# Patient Record
Sex: Male | Born: 1980
Health system: Southern US, Community
[De-identification: ages and names within clinical notes are randomized; demographics above are authoritative.]

## PROBLEM LIST (undated history)

## (undated) DIAGNOSIS — E119 Type 2 diabetes mellitus without complications: Secondary | ICD-10-CM

## (undated) DIAGNOSIS — K219 Gastro-esophageal reflux disease without esophagitis: Secondary | ICD-10-CM

## (undated) DIAGNOSIS — I10 Essential (primary) hypertension: Secondary | ICD-10-CM

## (undated) HISTORY — DX: Type 2 diabetes mellitus without complications: E11.9

## (undated) HISTORY — PX: NO PAST SURGERIES: SHX2092

## (undated) HISTORY — DX: Gastro-esophageal reflux disease without esophagitis: K21.9

## (undated) HISTORY — DX: Essential (primary) hypertension: I10

---

## 2018-07-29 ENCOUNTER — Telehealth: Payer: Self-pay

## 2018-07-29 NOTE — Telephone Encounter (Signed)
Called patient to do their pre-visit COVID screening.  Call went to voicemail. Unable to do prescreening.  

## 2018-07-30 ENCOUNTER — Encounter: Payer: Self-pay | Admitting: Family Medicine

## 2018-07-30 ENCOUNTER — Telehealth: Payer: Self-pay | Admitting: Family Medicine

## 2018-07-30 ENCOUNTER — Other Ambulatory Visit: Payer: Self-pay

## 2018-07-30 ENCOUNTER — Ambulatory Visit (INDEPENDENT_AMBULATORY_CARE_PROVIDER_SITE_OTHER): Payer: 59 | Admitting: Family Medicine

## 2018-07-30 VITALS — BP 158/91 | HR 103 | Temp 97.2°F | Resp 17 | Ht 74.0 in | Wt 211.2 lb

## 2018-07-30 DIAGNOSIS — E782 Mixed hyperlipidemia: Secondary | ICD-10-CM

## 2018-07-30 DIAGNOSIS — E1165 Type 2 diabetes mellitus with hyperglycemia: Secondary | ICD-10-CM

## 2018-07-30 DIAGNOSIS — Z0000 Encounter for general adult medical examination without abnormal findings: Secondary | ICD-10-CM | POA: Diagnosis not present

## 2018-07-30 DIAGNOSIS — Z130 Encounter for screening for diseases of the blood and blood-forming organs and certain disorders involving the immune mechanism: Secondary | ICD-10-CM | POA: Diagnosis not present

## 2018-07-30 DIAGNOSIS — Z23 Encounter for immunization: Secondary | ICD-10-CM | POA: Diagnosis not present

## 2018-07-30 DIAGNOSIS — R030 Elevated blood-pressure reading, without diagnosis of hypertension: Secondary | ICD-10-CM | POA: Diagnosis not present

## 2018-07-30 DIAGNOSIS — R9431 Abnormal electrocardiogram [ECG] [EKG]: Secondary | ICD-10-CM | POA: Diagnosis not present

## 2018-07-30 DIAGNOSIS — Z1389 Encounter for screening for other disorder: Secondary | ICD-10-CM

## 2018-07-30 DIAGNOSIS — Z13220 Encounter for screening for lipoid disorders: Secondary | ICD-10-CM | POA: Diagnosis not present

## 2018-07-30 DIAGNOSIS — K760 Fatty (change of) liver, not elsewhere classified: Secondary | ICD-10-CM

## 2018-07-30 DIAGNOSIS — Z131 Encounter for screening for diabetes mellitus: Secondary | ICD-10-CM | POA: Diagnosis not present

## 2018-07-30 DIAGNOSIS — Z789 Other specified health status: Secondary | ICD-10-CM

## 2018-07-30 DIAGNOSIS — R748 Abnormal levels of other serum enzymes: Secondary | ICD-10-CM

## 2018-07-30 DIAGNOSIS — K429 Umbilical hernia without obstruction or gangrene: Secondary | ICD-10-CM

## 2018-07-30 DIAGNOSIS — Z8249 Family history of ischemic heart disease and other diseases of the circulatory system: Secondary | ICD-10-CM | POA: Diagnosis not present

## 2018-07-30 DIAGNOSIS — Z1329 Encounter for screening for other suspected endocrine disorder: Secondary | ICD-10-CM | POA: Diagnosis not present

## 2018-07-30 DIAGNOSIS — F1090 Alcohol use, unspecified, uncomplicated: Secondary | ICD-10-CM

## 2018-07-30 LAB — POCT URINALYSIS DIP (CLINITEK)
Glucose, UA: 500 mg/dL — AB
Leukocytes, UA: NEGATIVE
Nitrite, UA: NEGATIVE
POC PROTEIN,UA: 100 — AB
Spec Grav, UA: 1.025 (ref 1.010–1.025)
Urobilinogen, UA: 0.2 E.U./dL
pH, UA: 5.5 (ref 5.0–8.0)

## 2018-07-30 NOTE — Progress Notes (Signed)
Patient ID: Austin Little, male    DOB: 12-13-1980, 38 y.o.   MRN: [id number]  PCP: Bing NeighborsHarris, Joselinne Lawal S, FNP  Chief Complaint  Patient presents with  . Establish Care  . Annual Exam    Subjective:  HPI Austin Little is a 38 y.o. male, light smoker presents for complete physical exam.  Medical problems include: GERD, which is controlled with omeprazole.  Health Promotion: Routine physical activity: none Current BMI: Body mass index is 27.12 kg/m.  Dietary habits:eats twice per day. Trying to decrease fast foods. Not many vegetables. Eat fruits, although not daily. Very minimal intake of vegetables. Other habits include:  Alcohol beverages which consists of mostly beers on average 6-8 per day. Liquor occasionally, although not daily.  Health Screening Current/Overdue:   Immunizations: TDAP Last Dental Exam: routine dental  Last Eye Exam: no problems    Current home medications include: Prior to Admission medications   Medication Sig Start Date End Date Taking? Authorizing Provider  omeprazole (PRILOSEC OTC) 20 MG tablet Take 20 mg by mouth daily.   Yes [provider]    Family History  Problem Relation Age of Onset  . Hypertension Father   . Heart attack Father 3250's with stent placed  . Diabetes Paternal Grandmother   . Diabetes Paternal Grandmother   . Hypertension Paternal Grandfather <50  . Heart attack Paternal Grandfather <50    No Known Allergies  Social History   Socioeconomic History  . Marital status: Married    Spouse name: Not on file  . Number of children: Not on file  . Years of education: Not on file  . Highest education level: Not on file  Occupational History  . Not on file  Social Needs  . Financial resource strain: Not on file  . Food insecurity    Worry: Not on file    Inability: Not on file  . Transportation needs    Medical: Not on file    Non-medical: Not on file  Tobacco Use  . Smoking status: Light Tobacco Smoker     Types: Cigars  . Smokeless tobacco: Never Used  Substance and Sexual Activity  . Alcohol use: Yes  . Drug use: Never  . Sexual activity: Not on file  Lifestyle  . Physical activity    Days per week: Not on file    Minutes per session: Not on file  . Stress: Not on file  Relationships  . Social Musicianconnections    Talks on phone: Not on file    Gets together: Not on file    Attends religious service: Not on file    Active member of club or organization: Not on file    Attends meetings of clubs or organizations: Not on file    Relationship status: Not on file  . Intimate partner violence    Fear of current or ex partner: Not on file    Emotionally abused: Not on file    Physically abused: Not on file    Forced sexual activity: Not on file  Other Topics Concern  . Not on file  Social History Narrative  . Not on file    Review of Systems Pertinent negatives listed in HPI Past Medical, Surgical Family and Social History reviewed and updated.  Objective:   Today's Vitals   07/30/18 0905  BP: (!) 158/91  Pulse: (!) 103  Resp: 17  Temp: (!) 97.2 F (36.2 C)  TempSrc: Temporal  SpO2: 95%  Weight:  211 lb 3.2 oz (95.8 kg)  Height: 6\' 2"  (1.88 m)    Wt Readings from Last 3 Encounters:  07/30/18 211 lb 3.2 oz (95.8 kg)    Physical Exam Constitutional: Patient appears well-developed and well-nourished. No distress. HENT: Normocephalic, atraumatic, External right and left ear normal. Oropharynx is clear and moist.  Eyes: Conjunctivae and EOM are normal. PERRLA, no scleral icterus. Neck: Normal ROM. Neck supple. No JVD. No tracheal deviation. No thyromegaly. CVS: RRR, S1/S2 +, no murmurs, no gallops, no carotid bruit.  Pulmonary: Effort and breath sounds normal, no stridor, rhonchi, wheezes, rales.  Abdominal: Soft. BS +, no distension, tenderness, rebound or guarding.  Musculoskeletal: Normal range of motion. No edema and no tenderness.  Neuro: Alert. Normal reflexes,  muscle tone coordination. No cranial nerve deficit. Skin: Skin is warm and dry. No rash noted. Not diaphoretic. No erythema. No pallor. Psychiatric: Normal mood and affect. Behavior, judgment, thought content normal.    Assessment & Plan:  1. Annual physical exam Age-specific anticipatory guidance provided   2. Screening for diabetes mellitus - Comprehensive metabolic panel - Hemoglobin A1c  3. Screening for thyroid disorder - Thyroid Panel With TSH  4. Screening, lipid - Lipid panel  5. Screening for deficiency anemia - CBC with Differential  6. Screening for blood or protein in urine - POCT URINALYSIS DIP (CLINITEK)  7. Family history of heart disease - EKG 12-Lead, non-specific T, with NSR. Asymptomatic.  -Continue monitoring for factors that will further increase risk for a cardiovascular event.  8. Elevated BP without diagnosis of hypertension Repeat pressure remains elevated. Patient will check BP at home and return in 4 weeks for BP Check. If elevated, we discussed anti-hypertensive therapy is warranted.   9. Umbilical hernia without obstruction and without gangrene -CT of abdomen pending. -Will refer to General Surgery once CT completed.  Return for follow-up in 4 weeks for blood pressure check.  6 months surveillance of blood pressure with provider.  Molli Barrows, FNP Primary Care at [facility] [address] [place] [phone]: [phone]

## 2018-07-30 NOTE — Telephone Encounter (Signed)
Please schedule patient for CT of abdomen. Orders in system. He prefers a Saturday or late evening appointment as he works in [place]

## 2018-07-30 NOTE — Patient Instructions (Addendum)
Thank you for choosing Primary Care at [facility] to be your medical home!    Austin Little was seen by Molli Barrows, FNP today.   Austin Little's primary care provider is No primary care provider on file..   For the best care possible, you should try to see Molli Barrows, FNP-C whenever you come to the clinic.   We look forward to seeing you again soon!  If you have any questions about your visit today, please call us at [phone] or feel free to reach your primary care provider via [place].    Preventive Care 39-12 Years Old, Male Preventive care refers to lifestyle choices and visits with your health care provider that can promote health and wellness. This includes:  A yearly physical exam. This is also called an annual well check.  Regular dental and eye exams.  Immunizations.  Screening for certain conditions.  Healthy lifestyle choices, such as eating a healthy diet, getting regular exercise, not using drugs or products that contain nicotine and tobacco, and limiting alcohol use. What can I expect for my preventive care visit? Physical exam Your health care provider will check:  Height and weight. These may be used to calculate body mass index (BMI), which is a measurement that tells if you are at a healthy weight.  Heart rate and blood pressure.  Your skin for abnormal spots. Counseling Your health care provider may ask you questions about:  Alcohol, tobacco, and drug use.  Emotional well-being.  Home and relationship well-being.  Sexual activity.  Eating habits.  Work and work Statistician. What immunizations do I need?  Influenza (flu) vaccine  This is recommended every year. Tetanus, diphtheria, and pertussis (Tdap) vaccine  You may need a Td booster every 10 years. Varicella (chickenpox) vaccine  You may need this vaccine if you have not already been vaccinated. Human papillomavirus (HPV) vaccine  If recommended by your health care  provider, you may need three doses over 6 months. Measles, mumps, and rubella (MMR) vaccine  You may need at least one dose of MMR. You may also need a second dose. Meningococcal conjugate (MenACWY) vaccine  One dose is recommended if you are 39-79 years old and a Market researcher living in a residence hall, or if you have one of several medical conditions. You may also need additional booster doses. Pneumococcal conjugate (PCV13) vaccine  You may need this if you have certain conditions and were not previously vaccinated. Pneumococcal polysaccharide (PPSV23) vaccine  You may need one or two doses if you smoke cigarettes or if you have certain conditions. Hepatitis A vaccine  You may need this if you have certain conditions or if you travel or work in places where you may be exposed to hepatitis A. Hepatitis B vaccine  You may need this if you have certain conditions or if you travel or work in places where you may be exposed to hepatitis B. Haemophilus influenzae type b (Hib) vaccine  You may need this if you have certain risk factors. You may receive vaccines as individual doses or as more than one vaccine together in one shot (combination vaccines). Talk with your health care provider about the risks and benefits of combination vaccines. What tests do I need? Blood tests  Lipid and cholesterol levels. These may be checked every 5 years starting at age 39.  Hepatitis C test.  Hepatitis B test. Screening   Diabetes screening. This is done by checking your blood sugar (glucose) after  you have not eaten for a while (fasting).  Sexually transmitted disease (STD) testing. Talk with your health care provider about your test results, treatment options, and if necessary, the need for more tests. Follow these instructions at home: Eating and drinking   Eat a diet that includes fresh fruits and vegetables, whole grains, lean protein, and low-fat dairy products.  Take  vitamin and mineral supplements as recommended by your health care provider.  Do not drink alcohol if your health care provider tells you not to drink.  If you drink alcohol: ? Limit how much you have to 0-2 drinks a day. ? Be aware of how much alcohol is in your drink. In the [place] one drink equals one 12 oz bottle of beer (355 mL), one 5 oz glass of wine (148 mL), or one 1 oz glass of hard liquor (44 mL). Lifestyle  Take daily care of your teeth and gums.  Stay active. Exercise for at least 30 minutes on 5 or more days each week.  Do not use any products that contain nicotine or tobacco, such as cigarettes, e-cigarettes, and chewing tobacco. If you need help quitting, ask your health care provider.  If you are sexually active, practice safe sex. Use a condom or other form of protection to prevent STIs (sexually transmitted infections). What's next?  Go to your health care provider once a year for a well check visit.  Ask your health care provider how often you should have your eyes and teeth checked.  Stay up to date on all vaccines. This information is not intended to replace advice given to you by your health care provider. Make sure you discuss any questions you have with your health care provider. Document Released: 02/28/2001 Document Revised: 12/27/2017 Document Reviewed: 12/27/2017 Elsevier Patient Education  2020 [place].  Heart Disease Prevention Heart disease is the leading cause of death in the world. Coronary artery disease is the most common cause of heart disease. This condition results when cholesterol and other substances (plaque) build up inside the walls of the blood vessels that supply your heart muscle (arteries). This buildup in arteries is called atherosclerosis. You can take actions to lower your risk of heart disease. How can heart disease affect me? Heart disease can cause many unpleasant symptoms and complications, such as:  Chest pain  (angina).  Reduced or blocked blood flow to your heart. This can cause: ? Irregular heartbeats (arrhythmias). ? Heart attack. ? Heart failure. What can increase my risk? The following factors may make you more likely to develop this condition:  High blood pressure (hypertension).  High cholesterol.  Smoking.  A diet high in saturated fats or trans fats.  Lack of physical activity.  Obesity.  Drinking too much alcohol.  Diabetes.  Having a family history of heart disease. What actions can I take to prevent heart disease? Nutrition   Eat a heart-healthy eating plan as told by your health care provider. Examples include the DASH (Dietary Approaches to Stop Hypertension) eating plan or the Mediterranean diet.  Generally, it is recommended that you: ? Eat less salt (sodium). Ask your health care provider how much sodium is safe for you. Most people should have less than 2,300 mg each day. ? Limit unhealthy fats, such as saturated and trans fats, in your diet. You can do this by eating low-fat dairy products, eating less red meat, and avoiding processed foods. ? Eat healthy fats (omega-3 fatty acids). These are found in fish, such as  mackerel or salmon. ? Eat more fruits and vegetables. You should try to fill one-half of your plate with fruits and vegetables at each meal. ? Eat more whole grains. ? Avoid foods and drinks that have added sugars. Lifestyle   Get regular exercise. This is one of the most important things you can do for your health. Generally, it is recommended that you: ? Exercise for at least 30 minutes on most days of the week (150 minutes each week). The exercise should increase your heart rate and make you sweat (aerobic exercise). ? Add strength exercises on at least 2 days each week.  Do not use any products that contain nicotine or tobacco, such as cigarettes and e-cigarettes. These can damage your heart and blood vessels. If you need help quitting, ask  your health care provider. Alcohol use  Do not drink alcohol if: ? Your health care provider tells you not to drink. ? You are pregnant, may be pregnant, or are planning to become pregnant.  If you drink alcohol, limit how much you have: ? 0-1 drink a day for women. ? 0-2 drinks a day for men.  Be aware of how much alcohol is in your drink. In the [place]., one drink equals one typical bottle of beer (12 oz), one-half glass of wine (5 oz), or one shot of hard liquor (1 oz). Medicines  Take over-the-counter and prescription medicines only as told by your health care provider.  Ask your health care provider whether you should take an aspirin every day. Taking aspirin may help reduce your risk of heart disease and stroke.  Depending on your risk factors, your health care provider may prescribe medicines to lower your risk of heart disease or to control related conditions. You may take medicine to: ? Lower cholesterol. ? Control blood pressure. ? Control diabetes. General information  Keep your blood pressure under control, as recommended by your health care provider. For most healthy people, the upper number of your blood pressure (systolic) should be no higher than 120, and the lower number (diastolic) no higher than 80. Treatment may be needed if your blood pressure is higher than 130/80.  Have your blood pressure checked at least every two years. Your health care provider may check your blood pressure more often if you have high blood pressure.  After age 50, have your cholesterol checked every 4-6 years. If you have risk factors for heart disease, you may need to have it checked more frequently. Treatment may be needed if your cholesterol is high.  Have your body mass index (BMI) checked every year. Your health care provider can calculate your BMI from your height and weight.  Work with your health care provider to lose weight, if needed, or to maintain a healthy weight. Where to find  more information:  Centers for Disease Control and Prevention: TextNotebook.fi  American Heart Association: www.heart.org ? Take a free online heart disease risk quiz to better understand your personal risk factors. Summary  Heart disease is the leading cause of death in the world.  Heart disease can cause chest pain, abnormal heart rhythms, heart attack, and heart failure.  High blood pressure, high cholesterol, and smoking are the main risk factors for heart disease, although other factors also contribute.  You can take actions to lower your chances of developing heart disease. Work with your health care provider to reduce your risk by following a heart-healthy diet, being physically active, and controlling your weight, blood pressure, and cholesterol level.  This information is not intended to replace advice given to you by your health care provider. Make sure you discuss any questions you have with your health care provider. Document Released: 08/17/2003 Document Revised: 01/17/2017 Document Reviewed: 01/17/2017 Elsevier Patient Education  2020 [employer].

## 2018-07-31 LAB — CBC WITH DIFFERENTIAL/PLATELET
Basophils Absolute: 0 10*3/uL (ref 0.0–0.2)
Basos: 1 %
EOS (ABSOLUTE): 0.1 10*3/uL (ref 0.0–0.4)
Eos: 1 %
Hematocrit: 48.5 % (ref 37.5–51.0)
Hemoglobin: 16.8 g/dL (ref 13.0–17.7)
Immature Grans (Abs): 0 10*3/uL (ref 0.0–0.1)
Immature Granulocytes: 0 %
Lymphocytes Absolute: 1.4 10*3/uL (ref 0.7–3.1)
Lymphs: 26 %
MCH: 32.2 pg (ref 26.6–33.0)
MCHC: 34.6 g/dL (ref 31.5–35.7)
MCV: 93 fL (ref 79–97)
Monocytes Absolute: 0.5 10*3/uL (ref 0.1–0.9)
Monocytes: 10 %
Neutrophils Absolute: 3.3 10*3/uL (ref 1.4–7.0)
Neutrophils: 62 %
Platelets: 297 10*3/uL (ref 150–450)
RBC: 5.22 x10E6/uL (ref 4.14–5.80)
RDW: 12.4 % (ref 11.6–15.4)
WBC: 5.3 10*3/uL (ref 3.4–10.8)

## 2018-07-31 LAB — LIPID PANEL
Chol/HDL Ratio: 8.8 ratio — ABNORMAL HIGH (ref 0.0–5.0)
Cholesterol, Total: 327 mg/dL — ABNORMAL HIGH (ref 100–199)
HDL: 37 mg/dL — ABNORMAL LOW (ref >39)
Triglycerides: 555 mg/dL (ref 0–149)

## 2018-07-31 LAB — THYROID PANEL WITH TSH
Free Thyroxine Index: 2.2 (ref 1.2–4.9)
T3 Uptake Ratio: 27 % (ref 24–39)
T4, Total: 8 ug/dL (ref 4.5–12.0)
TSH: 3.33 u[IU]/mL (ref 0.450–4.500)

## 2018-07-31 LAB — COMPREHENSIVE METABOLIC PANEL
ALT: 170 IU/L — ABNORMAL HIGH (ref 0–44)
AST: 129 IU/L — ABNORMAL HIGH (ref 0–40)
Albumin/Globulin Ratio: 2.2 (ref 1.2–2.2)
Albumin: 5.2 g/dL — ABNORMAL HIGH (ref 4.0–5.0)
Alkaline Phosphatase: 166 IU/L — ABNORMAL HIGH (ref 39–117)
BUN/Creatinine Ratio: 10 (ref 9–20)
BUN: 8 mg/dL (ref 6–20)
Bilirubin Total: 0.7 mg/dL (ref 0.0–1.2)
CO2: 23 mmol/L (ref 20–29)
Calcium: 9.7 mg/dL (ref 8.7–10.2)
Chloride: 99 mmol/L (ref 96–106)
Creatinine, Ser: 0.8 mg/dL (ref 0.76–1.27)
GFR calc Af Amer: 131 mL/min/{1.73_m2} (ref >59)
GFR calc non Af Amer: 113 mL/min/{1.73_m2} (ref >59)
Globulin, Total: 2.4 g/dL (ref 1.5–4.5)
Glucose: 312 mg/dL — ABNORMAL HIGH (ref 65–99)
Potassium: 4.2 mmol/L (ref 3.5–5.2)
Sodium: 139 mmol/L (ref 134–144)
Total Protein: 7.6 g/dL (ref 6.0–8.5)

## 2018-07-31 LAB — HEMOGLOBIN A1C
Est. average glucose Bld gHb Est-mCnc: 269 mg/dL
Hgb A1c MFr Bld: 11 % — ABNORMAL HIGH (ref 4.8–5.6)

## 2018-07-31 NOTE — Telephone Encounter (Addendum)
Will call [phone] to see if CPT 502 715 [id number] requires prior authorization.  Since ordering provider & imaging facility are both in network no PA is required.

## 2018-07-31 NOTE — Telephone Encounter (Signed)
CT scheduled for 08/02/2018 @ 3:45 pm. NPO at 12 pm. Patient will need to pick up contrast at [facility] today or tomorrow. Will drink first bottle of contrast at 2 pm & 2nd bottle at 3 pm.

## 2018-07-31 NOTE — Telephone Encounter (Signed)
Patient aware of appointment. States he'll have wife pick up contrast. Made a phone visit for 08/01/18 at 9:30 AM.

## 2018-08-01 ENCOUNTER — Encounter: Payer: Self-pay | Admitting: Family Medicine

## 2018-08-01 ENCOUNTER — Other Ambulatory Visit: Payer: Self-pay

## 2018-08-01 ENCOUNTER — Ambulatory Visit: Payer: 59 | Admitting: Family Medicine

## 2018-08-01 ENCOUNTER — Ambulatory Visit (INDEPENDENT_AMBULATORY_CARE_PROVIDER_SITE_OTHER): Payer: 59 | Admitting: Family Medicine

## 2018-08-01 DIAGNOSIS — E782 Mixed hyperlipidemia: Secondary | ICD-10-CM | POA: Diagnosis not present

## 2018-08-01 DIAGNOSIS — E1165 Type 2 diabetes mellitus with hyperglycemia: Secondary | ICD-10-CM | POA: Insufficient documentation

## 2018-08-01 DIAGNOSIS — R030 Elevated blood-pressure reading, without diagnosis of hypertension: Secondary | ICD-10-CM | POA: Diagnosis not present

## 2018-08-01 MED ORDER — FENOFIBRATE 145 MG PO TABS: 145.0000 mg | ORAL_TABLET | Freq: Every day | ORAL | 1 refills | Status: DC

## 2018-08-01 MED ORDER — METFORMIN HCL 1000 MG PO TABS: 1000.0000 mg | ORAL_TABLET | Freq: Two times a day (BID) | ORAL | 3 refills | Status: DC

## 2018-08-01 MED ORDER — BLOOD GLUCOSE MONITOR KIT: PACK | 0 refills | Status: DC

## 2018-08-01 MED ORDER — LOSARTAN POTASSIUM 25 MG PO TABS: 25.0000 mg | ORAL_TABLET | Freq: Every day | ORAL | 4 refills | Status: DC

## 2018-08-01 MED ORDER — SITAGLIPTIN PHOSPHATE 100 MG PO TABS: 100.0000 mg | ORAL_TABLET | Freq: Every day | ORAL | 1 refills | Status: DC

## 2018-08-01 MED FILL — metFORMIN HCL 1000 MG TABS: 1000 | 90 days supply | Qty: 180 | Fill #0

## 2018-08-01 MED FILL — FREESTYLE LITE METER: 20 days supply | Qty: 1 | Fill #0

## 2018-08-01 MED FILL — FREESTYLE LANCETS: 50 days supply | Qty: 200 | Fill #0

## 2018-08-01 MED FILL — FREESTYLE LITE TEST STRIP: 50 days supply | Qty: 200 | Fill #0

## 2018-08-01 MED FILL — LOSARTAN POTASSIUM 25 MG TA: 25 | 30 days supply | Qty: 30 | Fill #0

## 2018-08-01 MED FILL — FENOFIBRATE 145 MG TABS: 145 | 90 days supply | Qty: 90 | Fill #0

## 2018-08-01 MED FILL — JANUVIA 100 MG TABLET: 100 | 30 days supply | Qty: 30 | Fill #0

## 2018-08-01 NOTE — Progress Notes (Deleted)
Worked up patient for their telephone visit with provider Molli Barrows, FNP-C. Verified date of birth. Patient here to discuss recent lab results. KWalker, CMA.

## 2018-08-01 NOTE — Progress Notes (Signed)
Virtual Visit via Telephone Note  I connected with [name] on 08/01/18 at 10:50 AM EDT by telephone and verified that I am speaking with the correct person using two identifiers.  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 He is conducting today's encounter with use of his personal phone. His wife Mardene Celeste is also on another line which is conferenced in during today's encounter.  Provider: Located at primary care office    I discussed the limitations, risks, security and privacy concerns of performing an evaluation and management service by telephone and the availability of in person appointments. I also discussed with the patient that there may be a patient responsible charge related to this service. The patient expressed understanding and agreed to proceed.   History of Present Illness: Austin Little was seen in office two days ago for a routine physical exam.  He is present during today's telemedicine encounter to discuss his most recent lab results.  Patient's Results number significant for very poorly controlled type 2 diabetes which is a brand-new diagnosis for patient.  He denies any previous knowledge of having diabetes and denies any symptoms of hyperglycemia.  His A1c 2 days ago was 11.  Of significance his cholesterol was also grossly abnormal.  His triglycerides were 555 and total cholesterol 327.  Due to triglycerides being so grossly elevated LDLs were unable to be calculated.  During Mr. Reiland physical exam he did endorse a very heavy alcohol intake and sedentary lifestyle along with poor dietary choices.  Also of concern his liver enzymes were also grossly abnormal-AST 129 and ALT 170.  Patient is scheduled to have a CT of the abdomen and pelvis completed on 08/02/2018 for evaluation of umbilical hernia.  That study will also look at the liver however patient will need repeat liver enzymes at his follow-up appointment which is been scheduled for 4  weeks from now.  Assessment and Plan: 1. Type 2 Diabetes with hyperglycemia Start metformin 1000 mg twice daily and Januvia 100 mg once daily.  Educated extensively that if A1c does not significantly improve patient will likely warrant addition of long-acting insulin at his next follow-up appointment goal at follow-up appointment is for A1c to trend down to at least 10.5-10 or less.  Patient is encouraged to immediately begin routine physical activity and he must negative management to cut back substantially on his alcohol intake which is a huge contributor to poorly controlled diabetes. Starting on ARB for renal protection-patient did have a slightly elevated blood pressure reading during his initial visit.  Patient prescribed a glucometer to check blood sugar at least twice daily  2.  Mixed hypertriglyceridemia and hypercholesterolemia Start fenofibrate 145 mg once daily.  Will hold off on statin therapy given marked elevation of liver enzymes.  Encouraged dietary changes to include mostly fruits and vegetables.  He will need to discontinue eating any chips, sugary foods, fast food , and foods high in saturated fat at this will only worsen or his overall cholesterol  3.  Elevated liver enzymes Continue with CT of the abdomen pelvis this will allow views of the liver to rule out any underlying fatty liver disease.  If fatty liver disease is noted will refer to [facility] for further evaluation given patient's history of very heavy alcohol consumption.  patient and wife both verbalized understanding regarding labs and treatment plan as follows   Meds ordered this encounter  Medications  . sitaGLIPtin (JANUVIA) 100 MG tablet  Sig: Take 1 tablet (100 mg total) by mouth daily.    Dispense:  30 tablet    Refill:  1  . metFORMIN (GLUCOPHAGE) 1000 MG tablet    Sig: Take 1 tablet (1,000 mg total) by mouth 2 (two) times daily with a meal.    Dispense:  180 tablet    Refill:  3  . fenofibrate (TRICOR) 145  MG tablet    Sig: Take 1 tablet (145 mg total) by mouth daily.    Dispense:  90 tablet    Refill:  1  . DISCONTD: blood glucose meter kit and supplies KIT    Sig: Dispense based on patient and insurance preference. Use up to four times daily as directed. (ICD-10-E.11.89)    Dispense:  1 each    Refill:  0    Order Specific Question:   Number of strips    Answer:   200    Order Specific Question:   Number of lancets    Answer:   200  . losartan (COZAAR) 25 MG tablet    Sig: Take 1 tablet (25 mg total) by mouth daily.    Dispense:  30 tablet    Refill:  4  . blood glucose meter kit and supplies KIT    Sig: Dispense based on patient and insurance preference. Use up to four times daily as directed. (ICD-10-E.11.89)    Dispense:  1 each    Refill:  0    Order Specific Question:   Number of strips    Answer:   200    Order Specific Question:   Number of lancets    Answer:   200      Follow Up Instructions: Follow-up in 4 weeks with a provider for repeat liver enzymes, lipid panel, repeat A1C. Encouraged to bring meter to follow-up appointment.   I discussed the assessment and treatment plan with the patient. The patient was provided an opportunity to ask questions and all were answered. The patient agreed with the plan and demonstrated an understanding of the instructions.   The patient was advised to call back or seek an in-person evaluation if the symptoms worsen or if the condition fails to improve as anticipated.  I provided 20 minutes of non-face-to-face time during this encounter.   Molli Barrows, FNP-C

## 2018-08-02 ENCOUNTER — Ambulatory Visit (HOSPITAL_COMMUNITY): Payer: 59

## 2018-08-02 ENCOUNTER — Ambulatory Visit (HOSPITAL_COMMUNITY)
Admission: RE | Admit: 2018-08-02 | Discharge: 2018-08-02 | Disposition: A | Discharge status: Home / Self Care | Payer: 59 | Source: Ambulatory Visit | Attending: Family Medicine | Admitting: Family Medicine

## 2018-08-02 ENCOUNTER — Other Ambulatory Visit: Payer: 59

## 2018-08-02 ENCOUNTER — Other Ambulatory Visit: Payer: Self-pay

## 2018-08-02 DIAGNOSIS — K429 Umbilical hernia without obstruction or gangrene: Secondary | ICD-10-CM | POA: Diagnosis not present

## 2018-08-02 DIAGNOSIS — K760 Fatty (change of) liver, not elsewhere classified: Secondary | ICD-10-CM | POA: Diagnosis not present

## 2018-08-02 MED ORDER — IOHEXOL 300 MG/ML  SOLN
100.0000 mL | Freq: Once | INTRAMUSCULAR | Status: AC | PRN
  Administered 2018-08-02: 16:00:00 100 mL via INTRAVENOUS

## 2018-08-02 MED ORDER — SODIUM CHLORIDE (PF) 0.9 % IJ SOLN
INTRAMUSCULAR | Status: AC
  Filled 2018-08-02: qty 50

## 2018-08-05 ENCOUNTER — Telehealth: Payer: Self-pay | Admitting: Family Medicine

## 2018-08-05 NOTE — Telephone Encounter (Signed)
Austin Little, I apologize , please notify patient that I am also placing a referral to cardiology given his family history of  (first-degree relatives of male gender with severe heart disease I am referring him to cardiology for further evaluation given his  abnormally high cholesterol this places him at increased risk for cardiovascular event.  Given his work schedule and upcoming appointments he may have to push this appointment out which is fine.

## 2018-08-05 NOTE — Telephone Encounter (Signed)
Left voice mail to call back 

## 2018-08-05 NOTE — Telephone Encounter (Signed)
Patient verified date of birth. He is aware of findings on CT. CT confirmed umbilical hernia for which he has been referred to [facility] [place] [facility]. Patient aware that [facility] will contact him directly to schedule. Patient is aware that CT also revealed a fatty infiltrate located on his liver; related to heavy alcohol use. 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 [facility] will contact directly to schedule. Allow 2-4 weeks for their office to contact. Patient expressed understanding. Nat Christen, CMA

## 2018-08-05 NOTE — Addendum Note (Signed)
Addended by: Scot Jun on: 08/05/2018 11:16 AM   Modules accepted: Orders

## 2018-08-05 NOTE — Addendum Note (Signed)
Addended by: Scot Jun on: 08/05/2018 04:53 PM   Modules accepted: Orders

## 2018-08-05 NOTE — Telephone Encounter (Signed)
Patient's wife notified of CT results & recommendations. Expressed understanding. Is aware that they will be getting called from [facility] about appointments.

## 2018-08-05 NOTE — Telephone Encounter (Signed)
Contact patient to notify him as follows regarding his recent CT of the abdomen Patient CT of the abdomen did confirm a umbilical hernia for which she has been referred to [facility] surgery for evaluation of hernia.  Of note an incidental finding found on CT of the abdomen is that patient has a fatty infiltrate located on his liver which is what I suspected and is related to heavy alcohol use.  Given his significantly elevated liver enzymes along with this finding on CT of the abdomen I am referring him to [facility] liver care for further evaluation of alcohol induced fatty liver disease.  This referral is being placed more for proactive approach as to I am unable to determine by the studies have advanced or how severe the current damage is to his liver.  Given that he is very young vigilance is required to reduce the risk of complications associated with alcohol liver disease.  [facility] will contact patient directly to schedule an appointment please allow up to 2 to 4 weeks for their office to reach out to patient.

## 2018-08-06 NOTE — Telephone Encounter (Signed)
Patient's wife notified of referral.

## 2018-08-26 ENCOUNTER — Telehealth: Payer: Self-pay

## 2018-08-26 NOTE — Telephone Encounter (Signed)
Called patient to do their pre-visit COVID screening.  Have you been tested for COVID or are you currently waiting for COVID test results? no  Have you recently traveled internationally([place], [place], [place], [place], [place]) or within the [place] to a hotspot area([place], [place], [place], [place], [place])? no  Are you currently experiencing any of the following: fever, cough, SHOB, fatigue, body aches, loss of smell, rash, diarrhea, vomiting, severe headaches, weakness, sore throat? no  Have you been in contact with anyone who has recently travelled? no  Have you been in contact with anyone who is experiencing any of the above symptoms or been diagnosed with [place]  or works in or has recently visited a SNF? No  Asked patient to bring glucometer with him to appointment in the morning.

## 2018-08-27 ENCOUNTER — Ambulatory Visit (INDEPENDENT_AMBULATORY_CARE_PROVIDER_SITE_OTHER): Payer: 59 | Admitting: Internal Medicine

## 2018-08-27 ENCOUNTER — Encounter: Payer: Self-pay | Admitting: Internal Medicine

## 2018-08-27 ENCOUNTER — Other Ambulatory Visit: Payer: Self-pay

## 2018-08-27 VITALS — BP 127/81 | HR 87 | Temp 97.3°F | Resp 17 | Ht 74.0 in | Wt 213.6 lb

## 2018-08-27 DIAGNOSIS — F1010 Alcohol abuse, uncomplicated: Secondary | ICD-10-CM | POA: Diagnosis not present

## 2018-08-27 DIAGNOSIS — Z2821 Immunization not carried out because of patient refusal: Secondary | ICD-10-CM

## 2018-08-27 DIAGNOSIS — K700 Alcoholic fatty liver: Secondary | ICD-10-CM | POA: Diagnosis not present

## 2018-08-27 DIAGNOSIS — R945 Abnormal results of liver function studies: Secondary | ICD-10-CM

## 2018-08-27 DIAGNOSIS — K429 Umbilical hernia without obstruction or gangrene: Secondary | ICD-10-CM | POA: Diagnosis not present

## 2018-08-27 DIAGNOSIS — F17210 Nicotine dependence, cigarettes, uncomplicated: Secondary | ICD-10-CM | POA: Diagnosis not present

## 2018-08-27 DIAGNOSIS — I10 Essential (primary) hypertension: Secondary | ICD-10-CM | POA: Diagnosis not present

## 2018-08-27 DIAGNOSIS — R7989 Other specified abnormal findings of blood chemistry: Secondary | ICD-10-CM | POA: Insufficient documentation

## 2018-08-27 DIAGNOSIS — E782 Mixed hyperlipidemia: Secondary | ICD-10-CM

## 2018-08-27 DIAGNOSIS — E1142 Type 2 diabetes mellitus with diabetic polyneuropathy: Secondary | ICD-10-CM | POA: Diagnosis not present

## 2018-08-27 LAB — GLUCOSE, POCT (MANUAL RESULT ENTRY): POC Glucose: 130 mg/dl — AB (ref 70–99)

## 2018-08-27 NOTE — Progress Notes (Signed)
States that FSBS fasting have been in the 120s-130s

## 2018-08-27 NOTE — Progress Notes (Signed)
Patient ID: Austin Little, male    DOB: [birth year]  MRN: [id number]  CC: Diabetes and Hypertension   Subjective: Austin Little is a 38 y.o. male who presents for chronic ds management.  Patient last saw NP Molli Barrows about 1 month ago His concerns today include:  DM 2, mixed HL, ETOH use disorder, abn LFT, umbilical hernia, fatty liver, HTN, occasional cigar smoker  DIABETES TYPE 2 Last A1C:   Results for orders placed or performed in visit on 08/27/18  Glucose (CBG)  Result Value Ref Range   POC Glucose 130 (A) 70 - 99 mg/dl    Med Adherence:  '[x]'  Yes.  On Januvia and metformin    '[]'  No Medication side effects:  '[]'  Yes    '[x]'  No Home Monitoring?  '[x]'  Yes  At bedtime and sometimes before and after taking meds    Home glucose results range:  Few times BS over 200 but most part in 120.  Most recent A1c was 11. Diet Adherence:  Does not drink sodas, juices or sweet tea.  Not getting in much fruits or vegetables; was 6-8 12 oz beers a day now down to 2-4 beers/day.  He does not feel that he has a problem with alcohol.  Denied any issues like DWI or being dismissed from a job because of excessive EtOH use     Exercise: '[]'  Yes    '[x]'  No - no exercise outside of work.  He was going to the gym but stop due to umbilical hernia and has 1.5 hrs commute 1 way to and from work.   Hypoglycemic episodes?: '[]'  Yes    '[x]'  No Numbness of the feet? '[x]'  Yes  -constant tingling in feet for a little less than 1 yr Retinopathy hx? '[]'  Yes    '[]'  No Last eye exam:  No blurred vision. Plans to get eye exam done Comments:   HYPERTENSION Currently taking: see medication list Med Adherence: '[x]'  Yes    '[]'  No Medication side effects: '[]'  Yes    '[x]'  No Adherence with salt restriction: '[x]'  Yes    '[]'  No Home Monitoring?: '[x]'  Yes - wife checks it for him    Monitoring Frequency:  Every night Home BP results range:   Does not recall SOB? '[]'  Yes    '[x]'  No Chest Pain?: '[]'  Yes    '[x]'  No Leg swelling?: '[]'  Yes     '[x]'  No Headaches?: '[]'  Yes    '[x]'  No Dizziness? '[]'  Yes    '[x]'  No Comments:   Abn LFTs: Had recent CAT scan of the abdomen that revealed small umbilical hernia containing fat and fatty infiltration of the liver.  Patient noted to have elevation of AST and ALT and mild elevation in alk phos.  He denies any discomfort from the umbilical hernia  Hyperlipidemia: Found to have triglycerides greater than 500.  Patient was started on fenofibrate which he reports he has been taking consistently.  Patient Active Problem List   Diagnosis Date Noted  . Type 2 diabetes mellitus with hyperglycemia, without long-term current use of insulin ([place]) 08/01/2018     Current Outpatient Medications on File Prior to Visit  Medication Sig Dispense Refill  . fenofibrate (TRICOR) 145 MG tablet Take 1 tablet (145 mg total) by mouth daily. 90 tablet 1  . FREESTYLE LITE test strip     . Lancets (FREESTYLE) lancets     . losartan (COZAAR) 25 MG tablet Take 1 tablet (25 mg  total) by mouth daily. 30 tablet 4  . metFORMIN (GLUCOPHAGE) 1000 MG tablet Take 1 tablet (1,000 mg total) by mouth 2 (two) times daily with a meal. 180 tablet 3  . omeprazole (PRILOSEC OTC) 20 MG tablet Take 20 mg by mouth daily.    . sitaGLIPtin (JANUVIA) 100 MG tablet Take 1 tablet (100 mg total) by mouth daily. 30 tablet 1   No current facility-administered medications on file prior to visit.     No Known Allergies  Social History   Socioeconomic History  . Marital status: Married    Spouse name: Not on file  . Number of children: Not on file  . Years of education: Not on file  . Highest education level: Not on file  Occupational History  . Not on file  Social Needs  . Financial resource strain: Not on file  . Food insecurity    Worry: Not on file    Inability: Not on file  . Transportation needs    Medical: Not on file    Non-medical: Not on file  Tobacco Use  . Smoking status: Light Tobacco Smoker    Types: Cigars  .  Smokeless tobacco: Never Used  Substance and Sexual Activity  . Alcohol use: Yes  . Drug use: Never  . Sexual activity: Not on file  Lifestyle  . Physical activity    Days per week: Not on file    Minutes per session: Not on file  . Stress: Not on file  Relationships  . Social Herbalist on phone: Not on file    Gets together: Not on file    Attends religious service: Not on file    Active member of club or organization: Not on file    Attends meetings of clubs or organizations: Not on file    Relationship status: Not on file  . Intimate partner violence    Fear of current or ex partner: Not on file    Emotionally abused: Not on file    Physically abused: Not on file    Forced sexual activity: Not on file  Other Topics Concern  . Not on file  Social History Narrative  . Not on file    Family History  Problem Relation Age of Onset  . Hypertension Father   . Heart attack Father   . Diabetes Maternal Grandmother   . Diabetes Paternal Grandmother   . Hypertension Paternal Grandfather   . Heart attack Paternal Grandfather     ROS: Review of Systems Negative except as stated above  PHYSICAL EXAM: BP 127/81   Pulse 87   Temp (!) 97.3 F (36.3 C) (Temporal)   Resp 17   Ht '6\' 2"'  (1.88 m)   Wt 213 lb 9.6 oz (96.9 kg)   SpO2 98%   BMI 27.42 kg/m   Physical Exam General appearance - alert, well appearing, middle-age Caucasian male and in no distress Mental status - normal mood, behavior, speech, dress, motor activity, and thought processes Eyes - pupils equal and reactive, extraocular eye movements intact Nose - normal and patent, no erythema, discharge or polyps Mouth - mucous membranes moist, pharynx normal without lesions Neck - supple, no significant adenopathy Chest - clear to auscultation, no wheezes, rales or rhonchi, symmetric air entry Heart - normal rate, regular rhythm, normal S1, S2, no murmurs, rubs, clicks or gallops Abdomen -protruding  umbilical hernia about the size of a plum Extremities - peripheral pulses normal, no pedal edema,  no clubbing or cyanosis Diabetic Foot Exam - Simple   Simple Foot Form Visual Inspection No deformities, no ulcerations, no other skin breakdown bilaterally: Yes Sensation Testing Intact to touch and monofilament testing bilaterally: Yes Pulse Check Posterior Tibialis and Dorsalis pulse intact bilaterally: Yes Comments     CMP Latest Ref Rng & Units 07/30/2018  Glucose 65 - 99 mg/dL 312(H)  BUN 6 - 20 mg/dL 8  Creatinine 0.76 - 1.27 mg/dL 0.80  Sodium 134 - 144 mmol/L 139  Potassium 3.5 - 5.2 mmol/L 4.2  Chloride 96 - 106 mmol/L 99  CO2 20 - 29 mmol/L 23  Calcium 8.7 - 10.2 mg/dL 9.7  Total Protein 6.0 - 8.5 g/dL 7.6  Total Bilirubin 0.0 - 1.2 mg/dL 0.7  Alkaline Phos 39 - 117 IU/L 166(H)  AST 0 - 40 IU/L 129(H)  ALT 0 - 44 IU/L 170(H)   Lipid Panel     Component Value Date/Time   CHOL 327 (H) 07/30/2018 0952   TRIG 555 (HH) 07/30/2018 0952   HDL 37 (L) 07/30/2018 0952   CHOLHDL 8.8 (H) 07/30/2018 0952   LDLCALC Comment 07/30/2018 0952    CBC    Component Value Date/Time   WBC 5.3 07/30/2018 0952   RBC 5.22 07/30/2018 0952   HGB 16.8 07/30/2018 0952   HCT 48.5 07/30/2018 0952   PLT 297 07/30/2018 0952   MCV 93 07/30/2018 0952   MCH 32.2 07/30/2018 0952   MCHC 34.6 07/30/2018 0952   RDW 12.4 07/30/2018 0952   LYMPHSABS 1.4 07/30/2018 0952   EOSABS 0.1 07/30/2018 0952   BASOSABS 0.0 07/30/2018 0952   Depression screen PHQ 2/9 07/30/2018  Decreased Interest 0  Down, Depressed, Hopeless 0  PHQ - 2 Score 0  Altered sleeping 0  Tired, decreased energy 0  Change in appetite 0  Feeling bad or failure about yourself  0  Trouble concentrating 0  Moving slowly or fidgety/restless 0  Suicidal thoughts 0  PHQ-9 Score 0   ASSESSMENT AND PLAN: 1. DM type 2 with diabetic peripheral neuropathy (HCC) Reported blood sugars are better.  However I advised checking the blood  sugars 1-2 times a day before meals.  I went over the goal of 90-130 for blood sugars before meals and less than 182 hours after meals. -Continue metformin and Januvia. -Discussed healthy eating habits as it relates to diabetes.  Advised to incorporate fresh fruits and vegetables into the diet. -Went over symptoms of hypoglycemia and how to treat.  Advised to keep a pack of glucose tabs with him at all times. Discussed complications that can develop due to uncontrolled diabetes.  Advised to get an eye exam done every year.  He plans to schedule one himself for this year.  Good diabetic foot care discussed.  Encourage moderate intensity exercise at least 150 minutes/week - Glucose (CBG)  2. Essential hypertension Close to goal.  Continue Cozaar and low-salt diet  3. Fatty liver, alcoholic 4. Abnormal LFTs Discussed diagnosis of fatty liver.  New diagnosis of diabetes as well as excessive EtOH use are likely cause.  Advised patient that this can sometimes progress.  Encouraged him to cut back on drinking.  We went over how much is too much EtOH use per day and per week from males. - Hepatitis C Antibody - Hepatitis B surface antigen - Hepatic Function Panel  5. Mixed hyperlipidemia Ideally should be on a statin but will repeat LFTs first - Lipid panel  6. Light smoker Patient  advised to quit smoking. Discussed health risks associated with smoking including lung and other types of cancers, chronic lung diseases and CV risks.. Pt states that he will discontinue smoking   7. Alcohol use disorder, mild, abuse See #4 above  8. Umbilical hernia without obstruction and without gangrene Went over signs and symptoms that would suggest an incarcerated hernia for which he would need to seek immediate attention.  Patient reports that this is not bothersome to him and he does not want to pursue any surgical correction at this time.  I think this is reasonable.  Advised to avoid heavy lifting  9.  23-polyvalent pneumococcal polysaccharide vaccine declined  Patient was given the opportunity to ask questions.  Patient verbalized understanding of the plan and was able to repeat key elements of the plan.   Orders Placed This Encounter  Procedures  . Hepatitis C Antibody  . Hepatitis B surface antigen  . Hepatic Function Panel  . Lipid panel  . Glucose (CBG)     Requested Prescriptions    No prescriptions requested or ordered in this encounter    Return in about 2 months (around 10/27/2018).  Karle Plumber, MD, FACP

## 2018-08-27 NOTE — Patient Instructions (Signed)
Please remember to get your eye exam done once a year.    Check your blood sugars before breakfast and sometimes before dinner.  The goal before meals is 90-130.  If you check 2 hours after a meal, the goal is less than 180.  Check your blood pressure 1-2 times a week.  The goal is 130/80 or lower.   Try to cut back to no more than two 8 oz beer a day.

## 2018-08-28 LAB — HEPATIC FUNCTION PANEL
ALT: 78 IU/L — ABNORMAL HIGH (ref 0–44)
AST: 46 IU/L — ABNORMAL HIGH (ref 0–40)
Albumin: 5.1 g/dL — ABNORMAL HIGH (ref 4.0–5.0)
Alkaline Phosphatase: 83 IU/L (ref 39–117)
Bilirubin Total: 1 mg/dL (ref 0.0–1.2)
Bilirubin, Direct: 0.25 mg/dL (ref 0.00–0.40)
Total Protein: 7.4 g/dL (ref 6.0–8.5)

## 2018-08-28 LAB — LIPID PANEL
Chol/HDL Ratio: 5.6 ratio — ABNORMAL HIGH (ref 0.0–5.0)
Cholesterol, Total: 225 mg/dL — ABNORMAL HIGH (ref 100–199)
HDL: 40 mg/dL (ref >39)
LDL Calculated: 136 mg/dL — ABNORMAL HIGH (ref 0–99)
Triglycerides: 245 mg/dL — ABNORMAL HIGH (ref 0–149)
VLDL Cholesterol Cal: 49 mg/dL — ABNORMAL HIGH (ref 5–40)

## 2018-08-28 LAB — HEPATITIS B SURFACE ANTIGEN: Hepatitis B Surface Ag: NEGATIVE

## 2018-08-28 LAB — HEPATITIS C ANTIBODY: Hep C Virus Ab: <0.1 s/co ratio (ref 0.0–0.9)

## 2018-08-28 MED FILL — LOSARTAN POTASSIUM 25 MG TA: 25 | 30 days supply | Qty: 30 | Fill #1

## 2018-08-28 MED FILL — JANUVIA 100 MG TABLET: 100 | 30 days supply | Qty: 30 | Fill #1

## 2018-08-28 NOTE — Progress Notes (Signed)
Patient notified of results & recommendations. Expressed understanding.

## 2018-09-06 ENCOUNTER — Ambulatory Visit: Payer: 59 | Admitting: Cardiovascular Disease

## 2018-09-26 NOTE — Progress Notes (Signed)
Cardiology Office Note:   Date:  09/27/2018  NAME:  Austin Little    MRN: [id number] DOB:  1980/08/05   PCP:  Bing NeighborsHarris, Kimberly S, FNP  Cardiologist:  Reatha HarpsWesley T O'Neal, MD  Electrophysiologist:  None   Referring MD: Bing NeighborsHarris, Kimberly S, FNP   Chief Complaint  Patient presents with  . Abnormal ECG   History of Present Illness:   Austin Little is a 38 y.o. male with a hx of diabetes, hypertension, obesity who is being seen today for the evaluation of abnormal EKG at the request of Bing NeighborsHarris, Kimberly S, FNP.  He presents for evaluation of an abnormal EKG, nonspecific T wave abnormalities noted on EKG at his primary care physician's office.  He apparently has recently been diagnosed with new onset diabetes, and hyperlipidemia including hypertriglyceridemia.  He is recently started on metformin and Januvia, with an A1c of 11.0.  His triglycerides were in the 500 range, and he was started on TriCor.  His most recent lipid profile shows that his triglycerides have come down.  He reports this is all rather new to him, and he has been overwhelmed with the diagnoses.  He has also been diagnosed with high blood pressure it seems, but his carried this diagnosis for a while.  Prior to July of this year, he did not really see a physician regularly.  He reports no symptoms of chest pain, shortness of breath, lower extremity edema.  He reports he is here due to his strong family history, including myocardial infarction in his father and paternal grandfather.  Diabetes runs in his family.  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  He reports that he does not exercise regularly, however he has no limitations completing work or daily activities.  He also denies any symptoms of chest pain or chest pressure with activity.  EKG 07/30/2018 at PCP showed NSR with non-specific ST-T abnormalities.   Past Medical History: Past Medical History:  Diagnosis Date  . Diabetes mellitus without  complication (HCC)   . GERD (gastroesophageal reflux disease)   . Hypertension     Past Surgical History: Past Surgical History:  Procedure Laterality Date  . NO PAST SURGERIES      Current Medications: Current Meds  Medication Sig  . FREESTYLE LITE test strip   . Lancets (FREESTYLE) lancets   . losartan (COZAAR) 25 MG tablet Take 1 tablet (25 mg total) by mouth daily.  . metFORMIN (GLUCOPHAGE) 1000 MG tablet Take 1 tablet (1,000 mg total) by mouth 2 (two) times daily with a meal.  . omeprazole (PRILOSEC OTC) 20 MG tablet Take 20 mg by mouth daily.  . sitaGLIPtin (JANUVIA) 100 MG tablet Take 1 tablet (100 mg total) by mouth daily.  . [DISCONTINUED] fenofibrate (TRICOR) 145 MG tablet Take 1 tablet (145 mg total) by mouth daily.     Allergies:    Patient has no known allergies.   Social History: Social History   Socioeconomic History  . Marital status: Married    Spouse name: Not on file  . Number of children: Not on file  . Years of education: Not on file  . Highest education level: Not on file  Occupational History  . Not on file  Social Needs  . Financial resource strain: Not on file  . Food insecurity    Worry: Not on file    Inability: Not on file  . Transportation needs    Medical: Not on file  Non-medical: Not on file  Tobacco Use  . Smoking status: Light Tobacco Smoker    Types: Cigars  . Smokeless tobacco: Never Used  Substance and Sexual Activity  . Alcohol use: Yes  . Drug use: Never  . Sexual activity: Not on file  Lifestyle  . Physical activity    Days per week: Not on file    Minutes per session: Not on file  . Stress: Not on file  Relationships  . Social Musician on phone: Not on file    Gets together: Not on file    Attends religious service: Not on file    Active member of club or organization: Not on file    Attends meetings of clubs or organizations: Not on file    Relationship status: Not on file  Other Topics Concern   . Not on file  Social History Narrative   Gaffer      Family History: The patient's family history includes Diabetes in his maternal grandmother, mother, and paternal grandmother; Heart attack in his father, paternal grandfather, and paternal uncle; Hypertension in his father and paternal grandfather.  ROS:   All other ROS reviewed and negative. Pertinent positives noted in the HPI.     EKGs/Labs/Other Studies Reviewed:   The following studies were personally reviewed by me today:  EKG:  EKG is ordered today.  The ekg ordered today demonstrates Normal sinus rhythm, heart rate 94, normal intervals, nonspecific T wave abnormalities, frequent PVCs noted on Dr. Lovina Reach so is here cardiac is in history and high blood pressure, and was personally reviewed by me.   Recent Labs: 07/30/2018: BUN 8; Creatinine, Ser 0.80; Hemoglobin 16.8; Platelets 297; Potassium 4.2; Sodium 139; TSH 3.330 08/27/2018: ALT 78   Recent Lipid Panel    Component Value Date/Time   CHOL 225 (H) 08/27/2018 1007   TRIG 245 (H) 08/27/2018 1007   HDL 40 08/27/2018 1007   CHOLHDL 5.6 (H) 08/27/2018 1007   LDLCALC 136 (H) 08/27/2018 1007    Physical Exam:   VS:  BP 118/72   Pulse 94   Ht 6\' 3"  (1.905 m)   Wt 222 lb 6.4 oz (100.9 kg)   BMI 27.80 kg/m    Wt Readings from Last 3 Encounters:  09/27/18 222 lb 6.4 oz (100.9 kg)  08/27/18 213 lb 9.6 oz (96.9 kg)  07/30/18 211 lb 3.2 oz (95.8 kg)    General: Well nourished, well developed, in no acute distress Heart: Atraumatic, normal size  Eyes: PEERLA, EOMI  Neck: Supple, no JVD Endocrine: No thryomegaly Cardiac: Normal S1, S2; irregular rhythm due to PVCs, faint 2 out of 6 holosystolic murmur Lungs: Clear to auscultation bilaterally, no wheezing, rhonchi or rales  Abd: Soft, nontender, no hepatomegaly  Ext: No edema, pulses 2+ Musculoskeletal: No deformities, BUE and BLE strength normal and equal Skin: Warm and dry, no rashes   Neuro: Alert and  oriented to person, place, time, and situation, CNII-XII grossly intact, no focal deficits  Psych: Normal mood and affect   ASSESSMENT:   NAME@ is a 38 y.o. male who presents for the following: 1. Nonspecific abnormal electrocardiogram (ECG) (EKG)   2. PVC (premature ventricular contraction)   3. Mixed hyperlipidemia   4. Type 2 diabetes mellitus with hyperglycemia, without long-term current use of insulin (HCC)   5. Essential hypertension     PLAN:   1. Nonspecific abnormal electrocardiogram (ECG) (EKG) 2. PVC (premature ventricular contraction) -EKG with nonspecific T  wave abnormalities and frequent PVCs.  He also has noticeable PVCs on examination.  We will proceed with an echocardiogram to have a baseline cardiac profile for him.  3. Mixed hyperlipidemia -Severely elevated triglycerides and overall poor profile noted -He has been on TriCor, but we will switch him to Crestor 40 mg daily -Statins have better effect on LDL cholesterol, and also have an effect on triglycerides -I discussed with him the need to improve his diet lose weight he will do this -I will see him back in 2 months for follow-up and a repeat lipid profile  4. Type 2 diabetes mellitus with hyperglycemia, without long-term current use of insulin ([place]) -He is rather young, and his A1c is 11.0 -I will refer him to endocrinology today for better assistance of his diabetes  5. Essential hypertension -Well-controlled today continue losartan  Disposition: Return in about 2 months (around 11/27/2018).  Medication Adjustments/Labs and Tests Ordered: Current medicines are reviewed at length with the patient today.  Concerns regarding medicines are outlined above.  Orders Placed This Encounter  Procedures  . Lipid panel  . Ambulatory referral to Endocrinology  . EKG 12-Lead  . ECHOCARDIOGRAM COMPLETE   Meds ordered this encounter  Medications  . rosuvastatin (CRESTOR) 40 MG tablet    Sig: Take 1 tablet (40 mg  total) by mouth daily.    Dispense:  90 tablet    Refill:  3    Patient Instructions  Medication Instructions:  STOP- Fenofibrate START- Crestor 40 mg by mouth daily  If you need a refill on your cardiac medications before your next appointment, please call your pharmacy.  Labwork: Fasting Lipids in 2 Months HERE IN OUR OFFICE AT [facility] You will need to fast. DO NOT EAT OR DRINK PAST MIDNIGHT.       Take the provided lab slips with you to the lab for your blood draw.   When you have your labs (blood work) drawn today and your tests are completely normal, you will receive your results only by MyChart Message (if you have MyChart) -OR-  A paper copy in the mail.  If you have any lab test that is abnormal or we need to change your treatment, we will call you to review these results.  Testing/Procedures: Your physician has requested that you have an echocardiogram. Echocardiography is a painless test that uses sound waves to create images of your heart. It provides your doctor with information about the size and shape of your heart and how well your heart's chambers and valves are working. This procedure takes approximately one hour. There are no restrictions for this procedure.  Follow-Up: . Your physician recommends that you schedule a follow-up appointment in: 2 Months with Dr Rex Kras have been referred to Endocrinologist   At [facility], you and your health needs are our priority.  As part of our continuing mission to provide you with exceptional heart care, we have created designated Provider Care Teams.  These Care Teams include your primary Cardiologist (physician) and Advanced Practice Providers (APPs -  Physician Assistants and Nurse Practitioners) who all work together to provide you with the care you need, when you need it.  Thank you for choosing [facility] at [facility] [employer], [place] [address], [place]  [address], [place], [place] [address] [phone]  09/27/2018 3:26 PM

## 2018-09-27 ENCOUNTER — Ambulatory Visit (INDEPENDENT_AMBULATORY_CARE_PROVIDER_SITE_OTHER): Payer: 59 | Admitting: Cardiovascular Disease

## 2018-09-27 ENCOUNTER — Encounter: Payer: Self-pay | Admitting: Cardiovascular Disease

## 2018-09-27 ENCOUNTER — Other Ambulatory Visit: Payer: Self-pay

## 2018-09-27 VITALS — BP 118/72 | HR 94 | Ht 75.0 in | Wt 222.4 lb

## 2018-09-27 DIAGNOSIS — I493 Ventricular premature depolarization: Secondary | ICD-10-CM | POA: Diagnosis not present

## 2018-09-27 DIAGNOSIS — I10 Essential (primary) hypertension: Secondary | ICD-10-CM | POA: Diagnosis not present

## 2018-09-27 DIAGNOSIS — E782 Mixed hyperlipidemia: Secondary | ICD-10-CM | POA: Diagnosis not present

## 2018-09-27 DIAGNOSIS — R9431 Abnormal electrocardiogram [ECG] [EKG]: Secondary | ICD-10-CM | POA: Diagnosis not present

## 2018-09-27 DIAGNOSIS — E1165 Type 2 diabetes mellitus with hyperglycemia: Secondary | ICD-10-CM | POA: Diagnosis not present

## 2018-09-27 MED ORDER — ROSUVASTATIN CALCIUM 40 MG PO TABS: 40.0000 mg | ORAL_TABLET | Freq: Every day | ORAL | 3 refills | Status: DC

## 2018-09-27 NOTE — Patient Instructions (Signed)
Medication Instructions:  STOP- Fenofibrate START- Crestor 40 mg by mouth daily  If you need a refill on your cardiac medications before your next appointment, please call your pharmacy.  Labwork: Fasting Lipids in 2 Months HERE IN OUR OFFICE AT [facility] You will need to fast. DO NOT EAT OR DRINK PAST MIDNIGHT.       Take the provided lab slips with you to the lab for your blood draw.   When you have your labs (blood work) drawn today and your tests are completely normal, you will receive your results only by MyChart Message (if you have MyChart) -OR-  A paper copy in the mail.  If you have any lab test that is abnormal or we need to change your treatment, we will call you to review these results.  Testing/Procedures: Your physician has requested that you have an echocardiogram. Echocardiography is a painless test that uses sound waves to create images of your heart. It provides your doctor with information about the size and shape of your heart and how well your heart's chambers and valves are working. This procedure takes approximately one hour. There are no restrictions for this procedure.  Follow-Up: . Your physician recommends that you schedule a follow-up appointment in: 2 Months with Dr Rex Kras have been referred to Endocrinologist   At [facility], you and your health needs are our priority.  As part of our continuing mission to provide you with exceptional heart care, we have created designated Provider Care Teams.  These Care Teams include your primary Cardiologist (physician) and Advanced Practice Providers (APPs -  Physician Assistants and Nurse Practitioners) who all work together to provide you with the care you need, when you need it.  Thank you for choosing [facility] at [facility]!!

## 2018-10-01 ENCOUNTER — Other Ambulatory Visit: Payer: Self-pay | Admitting: Family Medicine

## 2018-10-01 MED FILL — LOSARTAN POTASSIUM 25 MG TA: 25 | 30 days supply | Qty: 30 | Fill #2

## 2018-10-01 NOTE — Telephone Encounter (Signed)
Requested medication (s) are due for refill today: yes  Requested medication (s) are on the active medication list: yes  Last refill:  08/28/2018  Future visit scheduled: yes  Notes to clinic:  Review for refill   Requested Prescriptions  Pending Prescriptions Disp Refills   JANUVIA 100 MG tablet [Pharmacy Med Name: JANUVIA 100 MG TABLET 100 TAB] 30 tablet 1    Sig: TAKE 1 TABLET (100 MG TOTAL) BY MOUTH DAILY.     There is no refill protocol information for this order

## 2018-10-02 ENCOUNTER — Telehealth: Payer: Self-pay | Admitting: Family Medicine

## 2018-10-02 MED ORDER — SITAGLIPTIN PHOSPHATE 100 MG PO TABS: 100.0000 mg | ORAL_TABLET | Freq: Every day | ORAL | 0 refills | Status: DC

## 2018-10-02 MED FILL — JANUVIA 100 MG TABLET: 100 | 30 days supply | Qty: 30 | Fill #0

## 2018-10-02 NOTE — Telephone Encounter (Signed)
1) Medication(s) Requested (by name): sitaGLIPtin (JANUVIA) 100 MG tablet [[id number]]   2) Pharmacy of Choice:  [place], [place]   Approved medications will be sent to pharmacy, we will reach out to you if there is an issue.  Requests made after 3pm may not be addressed until following business day!

## 2018-10-02 NOTE — Telephone Encounter (Signed)
Rxs sent

## 2018-10-04 ENCOUNTER — Ambulatory Visit (HOSPITAL_COMMUNITY): Payer: 59 | Attending: Cardiovascular Disease

## 2018-10-04 ENCOUNTER — Encounter: Payer: 59 | Attending: Family Medicine | Admitting: Dietician

## 2018-10-04 ENCOUNTER — Other Ambulatory Visit: Payer: Self-pay

## 2018-10-04 ENCOUNTER — Encounter: Payer: Self-pay | Admitting: Dietician

## 2018-10-04 DIAGNOSIS — I493 Ventricular premature depolarization: Secondary | ICD-10-CM | POA: Diagnosis not present

## 2018-10-04 DIAGNOSIS — E1165 Type 2 diabetes mellitus with hyperglycemia: Secondary | ICD-10-CM | POA: Insufficient documentation

## 2018-10-04 LAB — ECHOCARDIOGRAM COMPLETE
Height: 75 in
Weight: 3536 oz

## 2018-10-04 NOTE — Patient Instructions (Signed)
Plan: Great job on the changes that you have made! Consider adding a small breakfast. Consider ways to be more active.  Join the gym or walk on your days off. Continue to bake or grill rather than fry.  Aim for 3-4 Carb Choices per meal (45-60 grams) +/- 1 either way  Aim for 0-1 Carbs per snack if hungry  Include protein in moderation with your meals and snacks Consider reading food labels for Total Carbohydrate of foods Consider  increasing your activity level by walking or going to the gym for 30 minutes daily as tolerated Continue checking BG at alternate times per day  Continue taking medication as directed by MD

## 2018-10-04 NOTE — Progress Notes (Signed)
Diabetes Self-Management Education  Visit Type: First/Initial  Appt. Start Time: 1630 Appt. End Time: 1745  10/04/2018  Mr. Austin Little, identified by name and date of birth, is a 38 y.o. male with a diagnosis of Diabetes: Type 2.   ASSESSMENT History includes newly diagnosed type 2 diabetes, HLD, hypertriglyceridemia, alcoholic fatty liver, and GERD.  He has a history of heavy ETOH use (6-8 beers per night but has decreased to 2-4 beers per weeknights but will occasionally increase to 6-8 on the weekend), occasional cigar use.  Labs:  A1C 11% 07/30/2018, Cholesterol 224, HDL 40, LDL 136, Triglycerides 245 08/27/2018.  Medications include Januvia and Metformin.  He reports some heartburn and diarrhea with metformin.  Patient lives with his wife, 2 step sons ([phone] and [phone]).  He has another stepson that does not live with them and patient has 2 children (one in college and one in [place]). He works as an Gafferautomotive painter. His wife does the shopping and cooking recently as [place] in [place] and [place] a long commute. His wife often cooks high fat (increased butter and frying) but is working on changing this.  Patient has been eating lower fat choices and trying to lower his sodium intake as well.  Height 6\' 3"  (1.905 m), weight 221 lb (100.2 kg). Body mass index is 27.62 kg/m.  Diabetes Self-Management Education - 10/04/18 1443      Visit Information   Visit Type  First/Initial      Initial Visit   Diabetes Type  Type 2    Are you currently following a meal plan?  No    Are you taking your medications as prescribed?  Yes    Date Diagnosed  07/2018      Health Coping   How would you rate your overall health?  Fair      Psychosocial Assessment   Patient Belief/Attitude about Diabetes  Motivated to manage diabetes    Self-care barriers  None    Self-management support  Doctor's office    Other persons present  Patient    Patient Concerns  Nutrition/Meal planning;Healthy  Lifestyle;Glycemic Control    Special Needs  None    Preferred Learning Style  No preference indicated    Learning Readiness  Ready    How often do you need to have someone help you when you read instructions, pamphlets, or other written materials from your doctor or pharmacy?  1 - Never    What is the last grade level you completed in school?  12th grade      Pre-Education Assessment   Patient understands the diabetes disease and treatment process.  Needs Instruction    Patient understands incorporating nutritional management into lifestyle.  Needs Instruction    Patient undertands incorporating physical activity into lifestyle.  Needs Instruction    Patient understands using medications safely.  Needs Instruction    Patient understands monitoring blood glucose, interpreting and using results  Needs Instruction    Patient understands prevention, detection, and treatment of acute complications.  Needs Instruction    Patient understands prevention, detection, and treatment of chronic complications.  Needs Instruction    Patient understands how to develop strategies to address psychosocial issues.  Needs Instruction    Patient understands how to develop strategies to promote health/change behavior.  Needs Instruction      Complications   How often do you check your blood sugar?  1-2 times/day    Postprandial Blood glucose range (mg/dL)  56-213;[id number];130-179  Have you had a dilated eye exam in the past 12 months?  No    Have you had a dental exam in the past 12 months?  Yes    Are you checking your feet?  Yes    How many days per week are you checking your feet?  1      Dietary Intake   Breakfast  none    Snack (morning)  none    Lunch  [place] or ham sandwich on [employer], lite mayo, apple, carrots   2:00   Snack (afternoon)  rare    Dinner  grilled/baked chicken, salad OR Lean Cuisine OR salmon or other fish, salad, small amt salad dressing OR taco's with lean beef or chicken on [employer] bread   was  increased fried food   Snack (evening)  occasional peanut butter or mixed nuts    Beverage(s)  black coffee (whole pot), water, carbonated water, beer      Exercise   Exercise Type  Other (comment)   just work related     Patient Education   Previous Diabetes Education  No    Disease state   Definition of diabetes, type 1 and 2, and the diagnosis of diabetes;Factors that contribute to the development of diabetes    Nutrition management   Role of diet in the treatment of diabetes and the relationship between the three main macronutrients and blood glucose level;Food label reading, portion sizes and measuring food.;Carbohydrate counting;Information on hints to eating out and maintain blood glucose control.;Meal options for control of blood glucose level and chronic complications.;Meal timing in regards to the patients' current diabetes medication.    Physical activity and exercise   Role of exercise on diabetes management, blood pressure control and cardiac health.    Acute complications  Taught treatment of hypoglycemia - the 15 rule.    Chronic complications  Relationship between chronic complications and blood glucose control;Retinopathy and reason for yearly dilated eye exams    Psychosocial adjustment  Worked with patient to identify barriers to care and solutions;Identified and addressed patients feelings and concerns about diabetes    Personal strategies to promote health  Lifestyle issues that need to be addressed for better diabetes care      Individualized Goals (developed by patient)   Nutrition  General guidelines for healthy choices and portions discussed    Physical Activity  Exercise 3-5 times per week;30 minutes per day    Medications  take my medication as prescribed    Monitoring   test my blood glucose as discussed    Reducing Risk  do foot checks daily    Health Coping  discuss diabetes with (comment)   MD, RD, CDE     Post-Education Assessment   Patient understands the  diabetes disease and treatment process.  Demonstrates understanding / competency    Patient understands incorporating nutritional management into lifestyle.  Demonstrates understanding / competency    Patient undertands incorporating physical activity into lifestyle.  Demonstrates understanding / competency    Patient understands using medications safely.  Demonstrates understanding / competency    Patient understands monitoring blood glucose, interpreting and using results  Demonstrates understanding / competency    Patient understands prevention, detection, and treatment of acute complications.  Demonstrates understanding / competency    Patient understands prevention, detection, and treatment of chronic complications.  Demonstrates understanding / competency    Patient understands how to develop strategies to address psychosocial issues.  Demonstrates understanding / competency  Patient understands how to develop strategies to promote health/change behavior.  Demonstrates understanding / competency      Outcomes   Expected Outcomes  Demonstrated interest in learning. Expect positive outcomes    Future DMSE  Yearly;PRN    Program Status  Completed       Individualized Plan for Diabetes Self-Management Training:   Learning Objective:  Patient will have a greater understanding of diabetes self-management. Patient education plan is to attend individual and/or group sessions per assessed needs and concerns.   Plan:   Patient Instructions  Plan: Doristine Devoid job on the changes that you have made! Consider adding a small breakfast. Consider ways to be more active.  Join the gym or walk on your days off. Continue to bake or grill rather than fry.  Aim for 3-4 Carb Choices per meal (45-60 grams) +/- 1 either way  Aim for 0-1 Carbs per snack if hungry  Include protein in moderation with your meals and snacks Consider reading food labels for Total Carbohydrate of foods Consider  increasing your  activity level by walking or going to the gym for 30 minutes daily as tolerated Continue checking BG at alternate times per day  Continue taking medication as directed by MD      Expected Outcomes:  Demonstrated interest in learning. Expect positive outcomes  Education material provided: ADA - How to Thrive: A Guide for Your Journey with Diabetes, Food label handouts, Meal plan card, Snack sheet and Diabetes Resources, eating out tips, breakfast ideas  If problems or questions, patient to contact team via:  Phone, email  Future DSME appointment: Yearly, PRN

## 2018-10-11 DIAGNOSIS — K429 Umbilical hernia without obstruction or gangrene: Secondary | ICD-10-CM | POA: Diagnosis not present

## 2018-10-15 DIAGNOSIS — K760 Fatty (change of) liver, not elsewhere classified: Secondary | ICD-10-CM | POA: Diagnosis not present

## 2018-10-15 DIAGNOSIS — R748 Abnormal levels of other serum enzymes: Secondary | ICD-10-CM | POA: Diagnosis not present

## 2018-10-25 ENCOUNTER — Telehealth: Payer: Self-pay

## 2018-10-25 NOTE — Telephone Encounter (Signed)

## 2018-10-28 ENCOUNTER — Ambulatory Visit (INDEPENDENT_AMBULATORY_CARE_PROVIDER_SITE_OTHER): Payer: 59 | Admitting: Family Medicine

## 2018-10-28 ENCOUNTER — Other Ambulatory Visit: Payer: Self-pay

## 2018-10-28 VITALS — BP 137/86 | HR 97 | Temp 96.8°F | Resp 17 | Wt 227.4 lb

## 2018-10-28 DIAGNOSIS — I10 Essential (primary) hypertension: Secondary | ICD-10-CM | POA: Diagnosis not present

## 2018-10-28 DIAGNOSIS — E782 Mixed hyperlipidemia: Secondary | ICD-10-CM

## 2018-10-28 DIAGNOSIS — K429 Umbilical hernia without obstruction or gangrene: Secondary | ICD-10-CM | POA: Diagnosis not present

## 2018-10-28 DIAGNOSIS — Z79899 Other long term (current) drug therapy: Secondary | ICD-10-CM | POA: Diagnosis not present

## 2018-10-28 DIAGNOSIS — E1165 Type 2 diabetes mellitus with hyperglycemia: Secondary | ICD-10-CM | POA: Diagnosis not present

## 2018-10-28 DIAGNOSIS — K760 Fatty (change of) liver, not elsewhere classified: Secondary | ICD-10-CM

## 2018-10-28 LAB — GLUCOSE, POCT (MANUAL RESULT ENTRY): POC Glucose: 134 mg/dL — AB (ref 70–99)

## 2018-10-28 NOTE — Progress Notes (Signed)
Patient ID: HILDA WEXLER, male    DOB: 1980/01/26  MRN: [id number]   Subjective: Austin Little is a 38 y.o. male who presents for follow-up of chronic medical issues including type 2 diabetes, hypertension, hyperlipidemia and he reports that he has been seen and evaluated by surgery and plan is to have repair of umbilical hernia however patient states that he wishes to defer this until the end of the year, possibly late December.  He has had no further issues with pain in the umbilical area/abdominal pain.  He reports that his blood sugar control has improved and he wishes to delay surgery to make sure that his blood sugars are well controlled.  His fasting blood sugar at today's visit was 134.  He has not been checking his home blood sugars in the past few weeks.  He denies any increased thirst, urinary frequency and no blurred vision.  No current issues with numbness or tingling in the feet.        He denies any issues with headaches or dizziness related to his blood pressure.  He reports compliance with his losartan.  He denies any increased muscle or joint aches with the use of his cholesterol medicine, rosuvastatin.  He has had no abdominal discomfort/GI side effects due to the use of Glucophage/metformin.  He also continues to take Januvia.  Overall he feels well at this time.  He denies any chest pain or palpitations, he did see cardiology in September.  He denies any shortness of breath or cough.  He does occasionally smoke cigars.  Patient also works as a Financial controller.  He reports that he does use protective respiratory equipment.  Patient Active Problem List   Diagnosis Date Noted  . Essential hypertension 08/27/2018  . Fatty liver, alcoholic 67/20/9470  . Abnormal LFTs 08/27/2018  . Mixed hyperlipidemia 08/27/2018  . Light smoker 08/27/2018  . Alcohol use disorder, mild, abuse 08/27/2018  . Umbilical hernia without obstruction and without gangrene 08/27/2018  . Type 2 diabetes  mellitus with hyperglycemia, without long-term current use of insulin ([place]) 08/01/2018     Current Outpatient Medications on File Prior to Visit  Medication Sig Dispense Refill  . FREESTYLE LITE test strip     . Lancets (FREESTYLE) lancets     . losartan (COZAAR) 25 MG tablet Take 1 tablet (25 mg total) by mouth daily. 30 tablet 4  . metFORMIN (GLUCOPHAGE) 1000 MG tablet Take 1 tablet (1,000 mg total) by mouth 2 (two) times daily with a meal. 180 tablet 3  . omeprazole (PRILOSEC OTC) 20 MG tablet Take 20 mg by mouth daily.    . rosuvastatin (CRESTOR) 40 MG tablet Take 1 tablet (40 mg total) by mouth daily. 90 tablet 3  . sitaGLIPtin (JANUVIA) 100 MG tablet Take 1 tablet (100 mg total) by mouth daily. 30 tablet 0   No current facility-administered medications on file prior to visit.     No Known Allergies  Social History   Tobacco Use  . Smoking status: Light Tobacco Smoker    Types: Cigars  . Smokeless tobacco: Never Used  Substance Use Topics  . Alcohol use: Yes  . Drug use: Never     Family History  Problem Relation Age of Onset  . Hypertension Father   . Heart attack Father   . Diabetes Maternal Grandmother   . Diabetes Paternal Grandmother   . Hypertension Paternal Grandfather   . Heart attack Paternal Grandfather   . Diabetes Mother   .  Heart attack Paternal Uncle     Past Surgical History:  Procedure Laterality Date  . NO PAST SURGERIES      ROS: Review of Systems  Constitutional: Negative for chills, fatigue and fever.  HENT: Negative for congestion, postnasal drip, sneezing, sore throat and trouble swallowing.   Eyes: Negative for photophobia and visual disturbance.  Respiratory: Negative for cough and shortness of breath.   Cardiovascular: Negative for chest pain, palpitations and leg swelling.  Gastrointestinal: Negative for abdominal pain, blood in stool, constipation, diarrhea and nausea.  Endocrine: Negative for cold intolerance, heat intolerance,  polydipsia, polyphagia and polyuria.  Genitourinary: Negative for dysuria and frequency.  Musculoskeletal: Negative for back pain, gait problem and joint swelling.  Neurological: Negative for dizziness, numbness and headaches.  Hematological: Negative for adenopathy. Does not bruise/bleed easily.    PHYSICAL EXAM: BP 137/86   Pulse 97   Temp (!) 96.8 F (36 C) (Temporal)   Resp 17   Wt 227 lb 6.4 oz (103.1 kg)   SpO2 95%   BMI 28.42 kg/m    Physical Exam Constitutional:      General: He is not in acute distress.    Appearance: Normal appearance. He is not ill-appearing.  Neck:     Musculoskeletal: Normal range of motion and neck supple. No muscular tenderness.     Vascular: No carotid bruit.  Cardiovascular:     Rate and Rhythm: Normal rate and regular rhythm.     Pulses:          Dorsalis pedis pulses are 1+ on the right side and 2+ on the left side.       Posterior tibial pulses are 1+ on the right side and 2+ on the left side.  Pulmonary:     Effort: Pulmonary effort is normal.     Breath sounds: Normal breath sounds.  Abdominal:     Palpations: Abdomen is soft.     Tenderness: There is no abdominal tenderness. There is no right CVA tenderness, left CVA tenderness, guarding or rebound.     Hernia: A hernia (Soft, nontender, reducible umbilical hernia) is present.  Musculoskeletal:        General: No tenderness.     Right lower leg: No edema.     Left lower leg: No edema.     Right foot: Normal range of motion. No deformity.     Left foot: Normal range of motion. No deformity.  Feet:     Right foot:     Protective Sensation: 10 sites tested. 10 sites sensed.     Skin integrity: Skin integrity normal.     Toenail Condition: Right toenails are normal.     Left foot:     Protective Sensation: 10 sites tested. 10 sites sensed.     Skin integrity: Skin integrity normal.     Toenail Condition: Left toenails are normal.  Lymphadenopathy:     Cervical: No cervical  adenopathy.  Skin:    General: Skin is warm and dry.  Neurological:     General: No focal deficit present.     Mental Status: He is alert and oriented to person, place, and time.     Cranial Nerves: No cranial nerve deficit.  Psychiatric:        Mood and Affect: Mood normal.        Behavior: Behavior normal.      ASSESSMENT AND PLAN: 1. Type 2 diabetes mellitus with hyperglycemia, without long-term current use of insulin (HCC)  Patient with type 2 diabetes with prior hemoglobin A1c of 11.0 on 07/30/2018 indicating poor control of diabetes.  Patient is a few days early for hemoglobin A1c but this will be repeated today's visit.  He reports that he believes his blood sugar control has improved though he is not regularly monitoring his blood sugars.  His fasting blood sugar today was 134.  He will be notified if further changes in his medications are needed based on hemoglobin A1c and comprehensive metabolic panel that are being done at today's visit.  Patient has also met with diabetic nutritionist since his last visit.  He has been encouraged to monitor his blood sugars and follow a diabetic diet.  Information on monitoring of blood sugars was provided as part of after visit summary.  Patient encouraged to achieve hemoglobin A1c goal of 7 or less prior to planned surgery for repair of umbilical hernia.  Per cardiology note, it appears that patient was also referred to endocrinology. - Glucose (CBG) - Hemoglobin A1c - Comprehensive metabolic panel  2. Essential hypertension Blood pressure stable and reasonably controlled on current losartan 25 mg which can be increased at a future visit if needed.  3. Mixed hyperlipidemia; 4.  Encounter for long-term, current use of medications; 6.  Fatty liver Patient with mixed hyperlipidemia for which he is currently on rosuvastatin 40 mg daily which was started by cardiology at patient's visit on 09/27/2018.  He will have comprehensive metabolic panel in  follow-up for long-term use of medication as rosuvastatin can affect both creatinine and liver enzymes.  Patient had lipid panel done 08/27/2018 which showed total cholesterol of 225, triglycerides of 245, HDL of 40 and LDL of 136.  He reports that he is being compliant with rosuvastatin and low-fat diet was also encouraged as well as regular exercise.  Patient additionally with history of fatty liver/fatty infiltration of the liver which may also affect LFTs. - Comprehensive metabolic panel  5.  Umbilical hernia Patient was scheduled for surgery on 11/18/2018 but this is been canceled.  Patient reports that he wishes to have surgery for repair of umbilical hernia done later in the year.  He also has seen cardiology due to an abnormal EKG done 07/30/2018 with nonspecific ST-T abnormalities on review of chart.  Patient does have family history of CAD in addition to his recently diagnosed diabetes and hyperlipidemia.  Patient was found to have PVCs on EKG done by cardiology.  Patient will have 80-monthfollow-up with cardiology which will likely include preoperative clearance but will place request for cardiology to perform preoperative clearance at his next visit due to planned upcoming umbilical hernia repair surgery in December.  Also discussed with the patient that he should completely stop smoking as smoking can delay wound healing-patient reports current occasional cigar use.  Patient has also aware that his hemoglobin A1c goal is 7 or less as elevated blood sugar/A1c can also interfere with postsurgical wound healing and increase the risk of complications.  *Patient was offered but declined influenza immunization at today's visit  Allergies as of 10/28/2018   No Known Allergies     Medication List       Accurate as of October 28, 2018  9:10 AM. If you have any questions, ask your nurse or doctor.        freestyle lancets   FREESTYLE LITE test strip Generic drug: glucose blood   losartan 25  MG tablet Commonly known as: COZAAR Take 1 tablet (25 mg total) by  mouth daily.   metFORMIN 1000 MG tablet Commonly known as: GLUCOPHAGE Take 1 tablet (1,000 mg total) by mouth 2 (two) times daily with a meal.   omeprazole 20 MG tablet Commonly known as: PRILOSEC OTC Take 20 mg by mouth daily.   rosuvastatin 40 MG tablet Commonly known as: CRESTOR Take 1 tablet (40 mg total) by mouth daily.   sitaGLIPtin 100 MG tablet Commonly known as: Januvia Take 1 tablet (100 mg total) by mouth daily.        Return in about 4 months (around 02/28/2019) for DM.  Antony Blackbird, MD, Rosalita Chessman

## 2018-10-28 NOTE — Patient Instructions (Signed)
Blood Glucose Monitoring, Adult Monitoring your blood sugar (glucose) is an important part of managing your diabetes (diabetes mellitus). Blood glucose monitoring involves checking your blood glucose as often as directed and keeping a record (log) of your results over time. Checking your blood glucose regularly and keeping a blood glucose log can:  Help you and your health care provider adjust your diabetes management plan as needed, including your medicines or insulin.  Help you understand how food, exercise, illnesses, and medicines affect your blood glucose.  Let you know what your blood glucose is at any time. You can quickly find out if you have low blood glucose (hypoglycemia) or high blood glucose (hyperglycemia). Your health care provider will set individualized treatment goals for you. Your goals will be based on your age, other medical conditions you have, and how you respond to diabetes treatment. Generally, the goal of treatment is to maintain the following blood glucose levels:  Before meals (preprandial): 80-130 mg/dL (4.4-7.2 mmol/L).  After meals (postprandial): below 180 mg/dL (10 mmol/L).  A1c level: less than 7%. Supplies needed:  Blood glucose meter.  Test strips for your meter. Each meter has its own strips. You must use the strips that came with your meter.  A needle to prick your finger (lancet). Do not use a lancet more than one time.  A device that holds the lancet (lancing device).  A journal or log book to write down your results. How to check your blood glucose  1. Wash your hands with soap and water. 2. Prick the side of your finger (not the tip) with the lancet. Use a different finger each time. 3. Gently rub the finger until a small drop of blood appears. 4. Follow instructions that come with your meter for inserting the test strip, applying blood to the strip, and using your blood glucose meter. 5. Write down your result and any notes. Some meters  allow you to use areas of your body other than your finger (alternative sites) to test your blood. The most common alternative sites are:  Forearm.  Thigh.  Palm of the hand. If you think you may have hypoglycemia, or if you have a history of not knowing when your blood glucose is getting low (hypoglycemia unawareness), do not use alternative sites. Use your finger instead. Alternative sites may not be as accurate as the fingers, because blood flow is slower in these areas. This means that the result you get may be delayed, and it may be different from the result that you would get from your finger. Follow these instructions at home: Blood glucose log   Every time you check your blood glucose, write down your result. Also write down any notes about things that may be affecting your blood glucose, such as your diet and exercise for the day. This information can help you and your health care provider: ? Look for patterns in your blood glucose over time. ? Adjust your diabetes management plan as needed.  Check if your meter allows you to download your records to a computer. Most glucose meters store a record of glucose readings in the meter. If you have type 1 diabetes:  Check your blood glucose 2 or more times a day.  Also check your blood glucose: ? Before every insulin injection. ? Before and after exercise. ? Before meals. ? 2 hours after a meal. ? Occasionally between 2:00 a.m. and 3:00 a.m., as directed. ? Before potentially dangerous tasks, like driving or using heavy machinery. ?   At bedtime.  You may need to check your blood glucose more often, up to 6-10 times a day, if you: ? Use an insulin pump. ? Need multiple daily injections (MDI). ? Have diabetes that is not well-controlled. ? Are ill. ? Have a history of severe hypoglycemia. ? Have hypoglycemia unawareness. If you have type 2 diabetes:  If you take insulin or other diabetes medicines, check your blood glucose 2 or  more times a day.  If you are on intensive insulin therapy, check your blood glucose 4 or more times a day. Occasionally, you may also need to check between 2:00 a.m. and 3:00 a.m., as directed.  Also check your blood glucose: ? Before and after exercise. ? Before potentially dangerous tasks, like driving or using heavy machinery.  You may need to check your blood glucose more often if: ? Your medicine is being adjusted. ? Your diabetes is not well-controlled. ? You are ill. General tips  Always keep your supplies with you.  If you have questions or need help, all blood glucose meters have a 24-hour "hotline" phone number that you can call. You may also contact your health care provider.  After you use a few boxes of test strips, adjust (calibrate) your blood glucose meter by following instructions that came with your meter. Contact a health care provider if:  Your blood glucose is at or above 240 mg/dL (16.113.3 mmol/L) for 2 days in a row.  You have been sick or have had a fever for 2 days or longer, and you are not getting better.  You have any of the following problems for more than 6 hours: ? You cannot eat or drink. ? You have nausea or vomiting. ? You have diarrhea. Get help right away if:  Your blood glucose is lower than 54 mg/dL (3 mmol/L).  You become confused or you have trouble thinking clearly.  You have difficulty breathing.  You have moderate or large ketone levels in your urine. Summary  Monitoring your blood sugar (glucose) is an important part of managing your diabetes (diabetes mellitus).  Blood glucose monitoring involves checking your blood glucose as often as directed and keeping a record (log) of your results over time.  Your health care provider will set individualized treatment goals for you. Your goals will be based on your age, other medical conditions you have, and how you respond to diabetes treatment.  Every time you check your blood glucose,  write down your result. Also write down any notes about things that may be affecting your blood glucose, such as your diet and exercise for the day. This information is not intended to replace advice given to you by your health care provider. Make sure you discuss any questions you have with your health care provider. Document Released: 01/05/2003 Document Revised: 10/26/2017 Document Reviewed: 06/14/2015 Elsevier Patient Education  2020 [employer]  Dyslipidemia Dyslipidemia is an imbalance of waxy, fat-like substances (lipids) in the blood. The body needs lipids in small amounts. Dyslipidemia often involves a high level of cholesterol or triglycerides, which are types of lipids. Common forms of dyslipidemia include:  High levels of LDL cholesterol. LDL is the type of cholesterol that causes fatty deposits (plaques) to build up in the blood vessels that carry blood away from your heart (arteries).  Low levels of HDL cholesterol. HDL cholesterol is the type of cholesterol that protects against heart disease. High levels of HDL remove the LDL buildup from arteries.  High levels of triglycerides.  Triglycerides are a fatty substance in the blood that is linked to a buildup of plaques in the arteries. What are the causes? Primary dyslipidemia is caused by changes (mutations) in genes that are passed down through families (inherited). These mutations cause several types of dyslipidemia. Secondary dyslipidemia is caused by lifestyle choices and diseases that lead to dyslipidemia, such as:  Eating a diet that is high in animal fat.  Not getting enough exercise.  Having diabetes, kidney disease, liver disease, or thyroid disease.  Drinking large amounts of alcohol.  Using certain medicines. What increases the risk? You are more likely to develop this condition if you are an older man or if you are a woman who has gone through menopause. Other risk factors include:  Having a family history of  dyslipidemia.  Taking certain medicines, including birth control pills, steroids, some diuretics, and beta-blockers.  Smoking cigarettes.  Eating a high-fat diet.  Having certain medical conditions such as diabetes, polycystic ovary syndrome (PCOS), kidney disease, liver disease, or hypothyroidism.  Not exercising regularly.  Being overweight or obese with too much belly fat. What are the signs or symptoms? In most cases, dyslipidemia does not usually cause any symptoms. In severe cases, very high lipid levels can cause:  Fatty bumps under the skin (xanthomas).  White or gray ring around the black center (pupil) of the eye. Very high triglyceride levels can cause inflammation of the pancreas (pancreatitis). How is this diagnosed? Your health care provider may diagnose dyslipidemia based on a routine blood test (fasting blood test). Because most people do not have symptoms of the condition, this blood testing (lipid profile) is done on adults age 70 and older and is repeated every 5 years. This test checks:  Total cholesterol. This measures the total amount of cholesterol in your blood, including LDL cholesterol, HDL cholesterol, and triglycerides. A healthy number is below 200.  LDL cholesterol. The target number for LDL cholesterol is different for each person, depending on individual risk factors. Ask your health care provider what your LDL cholesterol should be.  HDL cholesterol. An HDL level of 60 or higher is best because it helps to protect against heart disease. A number below 40 for men or below 50 for women increases the risk for heart disease.  Triglycerides. A healthy triglyceride number is below 150. If your lipid profile is abnormal, your health care provider may do other blood tests. How is this treated? Treatment depends on the type of dyslipidemia that you have and your other risk factors for heart disease and stroke. Your health care provider will have a target range  for your lipid levels based on this information. For many people, this condition may be treated by lifestyle changes, such as diet and exercise. Your health care provider may recommend that you:  Get regular exercise.  Make changes to your diet.  Quit smoking if you smoke. If diet changes and exercise do not help you reach your goals, your health care provider may also prescribe medicine to lower lipids. The most commonly prescribed type of medicine lowers your LDL cholesterol (statin drug). If you have a high triglyceride level, your provider may prescribe another type of drug (fibrate) or an omega-3 fish oil supplement, or both. Follow these instructions at home:  Eating and drinking  Follow instructions from your health care provider or dietitian about eating or drinking restrictions.  Eat a healthy diet as told by your health care provider. This can help you reach and maintain  a healthy weight, lower your LDL cholesterol, and raise your HDL cholesterol. This may include: ? Limiting your calories, if you are overweight. ? Eating more fruits, vegetables, whole grains, fish, and lean meats. ? Limiting saturated fat, trans fat, and cholesterol.  If you drink alcohol: ? Limit how much you use. ? Be aware of how much alcohol is in your drink. In the [place] one drink equals one 12 oz bottle of beer (355 mL), one 5 oz glass of wine (148 mL), or one 1 oz glass of hard liquor (44 mL).  Do not drink alcohol if: ? Your health care provider tells you not to drink. ? You are pregnant, may be pregnant, or are planning to become pregnant. Activity  Get regular exercise. Start an exercise and strength training program as told by your health care provider. Ask your health care provider what activities are safe for you. Your health care provider may recommend: ? 30 minutes of aerobic activity 4-6 days a week. Brisk walking is an example of aerobic activity. ? Strength training 2 days a week. General  instructions  Do not use any products that contain nicotine or tobacco, such as cigarettes, e-cigarettes, and chewing tobacco. If you need help quitting, ask your health care provider.  Take over-the-counter and prescription medicines only as told by your health care provider. This includes supplements.  Keep all follow-up visits as told by your health care provider. Contact a health care provider if:  You are: ? Having trouble sticking to your exercise or diet plan. ? Struggling to quit smoking or control your use of alcohol. Summary  Dyslipidemia often involves a high level of cholesterol or triglycerides, which are types of lipids.  Treatment depends on the type of dyslipidemia that you have and your other risk factors for heart disease and stroke.  For many people, treatment starts with lifestyle changes, such as diet and exercise.  Your health care provider may prescribe medicine to lower lipids. This information is not intended to replace advice given to you by your health care provider. Make sure you discuss any questions you have with your health care provider. Document Released: 01/07/2013 Document Revised: 08/27/2017 Document Reviewed: 08/03/2017 Elsevier Patient Education  [place].

## 2018-10-28 NOTE — Progress Notes (Signed)
Hasn't been checked FSBS over the last 3-4 weeks.  Denies nausea, vomiting, polyuria, polydipsia. Still has numbness/tingling in feet.  Declines flu shot.

## 2018-10-29 LAB — COMPREHENSIVE METABOLIC PANEL WITH GFR
ALT: 72 IU/L — ABNORMAL HIGH (ref 0–44)
AST: 42 IU/L — ABNORMAL HIGH (ref 0–40)
Albumin/Globulin Ratio: 2.4 — ABNORMAL HIGH (ref 1.2–2.2)
Albumin: 4.7 g/dL (ref 4.0–5.0)
Alkaline Phosphatase: 133 IU/L — ABNORMAL HIGH (ref 39–117)
BUN/Creatinine Ratio: 6 — ABNORMAL LOW (ref 9–20)
BUN: 5 mg/dL — ABNORMAL LOW (ref 6–20)
Bilirubin Total: 0.4 mg/dL (ref 0.0–1.2)
CO2: 21 mmol/L (ref 20–29)
Calcium: 9.5 mg/dL (ref 8.7–10.2)
Chloride: 103 mmol/L (ref 96–106)
Creatinine, Ser: 0.81 mg/dL (ref 0.76–1.27)
GFR calc Af Amer: 129 mL/min/1.73
GFR calc non Af Amer: 112 mL/min/1.73
Globulin, Total: 2 g/dL (ref 1.5–4.5)
Glucose: 141 mg/dL — ABNORMAL HIGH (ref 65–99)
Potassium: 4.3 mmol/L (ref 3.5–5.2)
Sodium: 142 mmol/L (ref 134–144)
Total Protein: 6.7 g/dL (ref 6.0–8.5)

## 2018-10-29 LAB — HEMOGLOBIN A1C
Est. average glucose Bld gHb Est-mCnc: 108 mg/dL
Hgb A1c MFr Bld: 5.4 % (ref 4.8–5.6)

## 2018-10-30 NOTE — Progress Notes (Signed)
Patient notified of results & recommendations. Expressed understanding.

## 2018-10-31 ENCOUNTER — Other Ambulatory Visit: Payer: Self-pay | Admitting: Nurse Practitioner

## 2018-10-31 ENCOUNTER — Ambulatory Visit: Payer: Self-pay | Admitting: General Surgery

## 2018-10-31 ENCOUNTER — Telehealth: Payer: Self-pay | Admitting: Family Medicine

## 2018-10-31 MED ORDER — SITAGLIPTIN PHOSPHATE 100 MG PO TABS: 100.0000 mg | ORAL_TABLET | Freq: Every day | ORAL | 1 refills | Status: DC

## 2018-10-31 MED FILL — ROSUVASTATIN CALCIUM 40 MG: 40 | 90 days supply | Qty: 90 | Fill #0

## 2018-10-31 MED FILL — metFORMIN HCL 1000 MG TABS: 1000 | 90 days supply | Qty: 180 | Fill #1

## 2018-10-31 MED FILL — LOSARTAN POTASSIUM 25 MG TA: 25 | 30 days supply | Qty: 30 | Fill #3

## 2018-10-31 MED FILL — JANUVIA 100 MG TABLET: 100 | 60 days supply | Qty: 60 | Fill #0

## 2018-10-31 NOTE — Telephone Encounter (Signed)
90 day with 1 RF sent.

## 2018-10-31 NOTE — Telephone Encounter (Signed)
1) Medication(s) Requested (by name): JANUVIA 100 MG tablet [[id number]]   2) Pharmacy of Choice:  [facility], [place]  Asking for more refills to limit the calling   Approved medications will be sent to pharmacy, we will reach out to you if there is an issue.  Requests made after 3pm may not be addressed until following business day!

## 2018-11-14 ENCOUNTER — Other Ambulatory Visit (HOSPITAL_COMMUNITY): Payer: 59

## 2018-11-18 ENCOUNTER — Ambulatory Visit: Admit: 2018-11-18 | Payer: 59 | Admitting: General Surgery

## 2018-11-18 SURGERY — REPAIR, HERNIA, UMBILICAL, ADULT
Anesthesia: General

## 2018-11-25 ENCOUNTER — Ambulatory Visit: Payer: 59 | Admitting: Cardiovascular Disease

## 2018-12-06 ENCOUNTER — Other Ambulatory Visit: Payer: Self-pay

## 2018-12-06 ENCOUNTER — Ambulatory Visit (INDEPENDENT_AMBULATORY_CARE_PROVIDER_SITE_OTHER): Payer: 59 | Admitting: Medical

## 2018-12-06 ENCOUNTER — Encounter: Payer: Self-pay | Admitting: Medical

## 2018-12-06 VITALS — BP 154/80 | HR 83 | Temp 98.4°F | Ht 75.0 in | Wt 227.6 lb

## 2018-12-06 DIAGNOSIS — E782 Mixed hyperlipidemia: Secondary | ICD-10-CM

## 2018-12-06 DIAGNOSIS — E1165 Type 2 diabetes mellitus with hyperglycemia: Secondary | ICD-10-CM | POA: Diagnosis not present

## 2018-12-06 DIAGNOSIS — I10 Essential (primary) hypertension: Secondary | ICD-10-CM

## 2018-12-06 NOTE — Patient Instructions (Signed)
Medication Instructions:   Your physician recommends that you continue on your current medications as directed. Please refer to the Current Medication list given to you today.  *If you need a refill on your cardiac medications before your next appointment, please call your pharmacy*  Lab Work: You will need to have labs (blood work) drawn today:  Fasting Lipid Panel  Liver Function Test If you have labs (blood work) drawn today and your tests are completely normal, you will receive your results only by:  MyChart Message (if you have MyChart) OR  A paper copy in the mail If you have any lab test that is abnormal or we need to change your treatment, we will call you to review the results.  Testing/Procedures:  NONE ordered at this time of appointment   Follow-Up: At [facility], you and your health needs are our priority.  As part of our continuing mission to provide you with exceptional heart care, we have created designated Provider Care Teams.  These Care Teams include your primary Cardiologist (physician) and Advanced Practice Providers (APPs -  Physician Assistants and Nurse Practitioners) who all work together to provide you with the care you need, when you need it.  Your next appointment:   12 month(s)  The format for your next appointment:   In Person  Provider:   Lennie Odor, MD  Other Instructions  Keep a blood pressure log, if your blood pressure is constantly over 130/80 please give our office  Read the [place] and the information on a Low Sodium diet   Low-Sodium Eating Plan Sodium, which is an element that makes up salt, helps you maintain a healthy balance of fluids in your body. Too much sodium can increase your blood pressure and cause fluid and waste to be held in your body. Your health care provider or dietitian may recommend following this plan if you have high blood pressure (hypertension), kidney disease, liver disease, or heart failure. Eating  less sodium can help lower your blood pressure, reduce swelling, and protect your heart, liver, and kidneys. What are tips for following this plan? General guidelines  Most people on this plan should limit their sodium intake to 1,500-2,000 mg (milligrams) of sodium each day. Reading food labels   The Nutrition Facts label lists the amount of sodium in one serving of the food. If you eat more than one serving, you must multiply the listed amount of sodium by the number of servings.  Choose foods with less than 140 mg of sodium per serving.  Avoid foods with 300 mg of sodium or more per serving. Shopping  Look for lower-sodium products, often labeled as "low-sodium" or "no salt added."  Always check the sodium content even if foods are labeled as "unsalted" or "no salt added".  Buy fresh foods. ? Avoid canned foods and premade or frozen meals. ? Avoid canned, cured, or processed meats  Buy breads that have less than 80 mg of sodium per slice. Cooking  Eat more home-cooked food and less restaurant, buffet, and fast food.  Avoid adding salt when cooking. Use salt-free seasonings or herbs instead of table salt or sea salt. Check with your health care provider or pharmacist before using salt substitutes.  Cook with plant-based oils, such as canola, sunflower, or olive oil. Meal planning  When eating at a restaurant, ask that your food be prepared with less salt or no salt, if possible.  Avoid foods that contain MSG (monosodium glutamate). MSG is sometimes  added to [place] food, bouillon, and some canned foods. What foods are recommended? The items listed may not be a complete list. Talk with your dietitian about what dietary choices are best for you. Grains Low-sodium cereals, including oats, puffed wheat and rice, and shredded wheat. Low-sodium crackers. Unsalted rice. Unsalted pasta. Low-sodium bread. Whole-grain breads and whole-grain pasta. Vegetables Fresh or frozen  vegetables. "No salt added" canned vegetables. "No salt added" tomato sauce and paste. Low-sodium or reduced-sodium tomato and vegetable juice. Fruits Fresh, frozen, or canned fruit. Fruit juice. Meats and other protein foods Fresh or frozen (no salt added) meat, poultry, seafood, and fish. Low-sodium canned tuna and salmon. Unsalted nuts. Dried peas, beans, and lentils without added salt. Unsalted canned beans. Eggs. Unsalted nut butters. Dairy Milk. Soy milk. Cheese that is naturally low in sodium, such as ricotta cheese, fresh mozzarella, or Swiss cheese Low-sodium or reduced-sodium cheese. Cream cheese. Yogurt. Fats and oils Unsalted butter. Unsalted margarine with no trans fat. Vegetable oils such as canola or olive oils. Seasonings and other foods Fresh and dried herbs and spices. Salt-free seasonings. Low-sodium mustard and ketchup. Sodium-free salad dressing. Sodium-free light mayonnaise. Fresh or refrigerated horseradish. Lemon juice. Vinegar. Homemade, reduced-sodium, or low-sodium soups. Unsalted popcorn and pretzels. Low-salt or salt-free chips. What foods are not recommended? The items listed may not be a complete list. Talk with your dietitian about what dietary choices are best for you. Grains Instant hot cereals. Bread stuffing, pancake, and biscuit mixes. Croutons. Seasoned rice or pasta mixes. Noodle soup cups. Boxed or frozen macaroni and cheese. Regular salted crackers. Self-rising flour. Vegetables Sauerkraut, pickled vegetables, and relishes. Olives. [place] fries. Onion rings. Regular canned vegetables (not low-sodium or reduced-sodium). Regular canned tomato sauce and paste (not low-sodium or reduced-sodium). Regular tomato and vegetable juice (not low-sodium or reduced-sodium). Frozen vegetables in sauces. Meats and other protein foods Meat or fish that is salted, canned, smoked, spiced, or pickled. Bacon, ham, sausage, hotdogs, corned beef, chipped beef, packaged lunch  meats, salt pork, jerky, pickled herring, anchovies, regular canned tuna, sardines, salted nuts. Dairy Processed cheese and cheese spreads. Cheese curds. Blue cheese. Feta cheese. String cheese. Regular cottage cheese. Buttermilk. Canned milk. Fats and oils Salted butter. Regular margarine. Ghee. Bacon fat. Seasonings and other foods Onion salt, garlic salt, seasoned salt, table salt, and sea salt. Canned and packaged gravies. Worcestershire sauce. Tartar sauce. Barbecue sauce. Teriyaki sauce. Soy sauce, including reduced-sodium. Steak sauce. Fish sauce. Oyster sauce. Cocktail sauce. Horseradish that you find on the shelf. Regular ketchup and mustard. Meat flavorings and tenderizers. Bouillon cubes. Hot sauce and Tabasco sauce. Premade or packaged marinades. Premade or packaged taco seasonings. Relishes. Regular salad dressings. Salsa. Potato and tortilla chips. Corn chips and puffs. Salted popcorn and pretzels. Canned or dried soups. Pizza. Frozen entrees and pot pies. Summary  Eating less sodium can help lower your blood pressure, reduce swelling, and protect your heart, liver, and kidneys.  Most people on this plan should limit their sodium intake to 1,500-2,000 mg (milligrams) of sodium each day.  Canned, boxed, and frozen foods are high in sodium. Restaurant foods, fast foods, and pizza are also very high in sodium. You also get sodium by adding salt to food.  Try to cook at home, eat more fresh fruits and vegetables, and eat less fast food, canned, processed, or prepared foods. This information is not intended to replace advice given to you by your health care provider. Make sure you discuss any questions you have with your  health care provider. Document Released: 06/24/2001 Document Revised: 12/15/2016 Document Reviewed: 12/27/2015 Elsevier Patient Education  2020 [employer].

## 2018-12-06 NOTE — Progress Notes (Signed)
Cardiology Office Note   Date:  12/06/2018   ID:  Austin Little, DOB Mar 13, 1980, MRN [id number]  PCP:  Bing Neighbors, FNP  Cardiologist:  Reatha Harps, MD EP: None  Chief Complaint  Patient presents with  . Follow-up    HTN, HLD      History of Present Illness: Austin Little is a 38 y.o. male with a PMH of HTN, mixed hyperlipidemia, DM type 2, and GERD, who presents for follow-up of his HLD.  He was last evaluated by cardiology at an outpatient visit with Dr. Flora Lipps 09/27/2018 to establish care and evaluate an abnormal EKG (non-specific ST-T wave abnormalities). He had no specific cardiac complaints at that time but was noted to have frequent PVCs on the EKG at that visit. He underwent an echocardiogram which revealed EF 60-65%, no LVH, and moderate LAE. He was transitioned from TriCor to crestor for improved lipid/triglyceride control and was referred to endocrinology for management of his diabetes given A1C of 11 and need for A1C to be at goal of 7 prior to planned hernia repair. Repeat A1C 10/28/2018 was 5.4.   He presents today for follow-up of his HLD and HTN. He has been doing well since he was last seen. No complaints of chest pain, SOB, DOE, dizziness, lightheadedness, syncope, orthopnea, PND, LE edema. He has been fasting all day to have blood work done. He reports he did not take his medications this morning since he was fasting.     Past Medical History:  Diagnosis Date  . Diabetes mellitus without complication (HCC)   . GERD (gastroesophageal reflux disease)   . Hypertension     Past Surgical History:  Procedure Laterality Date  . NO PAST SURGERIES       Current Outpatient Medications  Medication Sig Dispense Refill  . FREESTYLE LITE test strip     . Lancets (FREESTYLE) lancets     . losartan (COZAAR) 25 MG tablet Take 1 tablet (25 mg total) by mouth daily. 30 tablet 4  . metFORMIN (GLUCOPHAGE) 1000 MG tablet Take 1 tablet (1,000 mg total) by  mouth 2 (two) times daily with a meal. 180 tablet 3  . omeprazole (PRILOSEC OTC) 20 MG tablet Take 20 mg by mouth daily.    . rosuvastatin (CRESTOR) 40 MG tablet Take 1 tablet (40 mg total) by mouth daily. 90 tablet 3  . sitaGLIPtin (JANUVIA) 100 MG tablet Take 1 tablet (100 mg total) by mouth daily. 90 tablet 1   No current facility-administered medications for this visit.     Allergies:   Patient has no known allergies.    Social History:  The patient  reports that he has been smoking cigars. He has never used smokeless tobacco. He reports current alcohol use. He reports that he does not use drugs.   Family History:  The patient's family history includes Diabetes in his maternal grandmother, mother, and paternal grandmother; Heart attack in his father, paternal grandfather, and paternal uncle; Hypertension in his father and paternal grandfather.    ROS:  Please see the history of present illness.   Otherwise, review of systems are positive for none.   All other systems are reviewed and negative.    PHYSICAL EXAM: VS:  BP (!) 154/80   Pulse 83   Temp 98.4 F (36.9 C)   Ht 6\' 3"  (1.905 m)   Wt 227 lb 9.6 oz (103.2 kg)   SpO2 97%   BMI 28.45 kg/m  ,  BMI Body mass index is 28.45 kg/m. GEN: Well nourished, well developed, in no acute distress HEENT: sclera anicteric Neck: no JVD, carotid bruits, or masses Cardiac: RRR; no murmurs, rubs, or gallops, no edema  Respiratory:  clear to auscultation bilaterally, normal work of breathing GI: soft, nontender, nondistended, + BS MS: no deformity or atrophy Skin: warm and dry, no rash Neuro:  Strength and sensation are intact Psych: euthymic mood, full affect   EKG:  EKG is not ordered today.   Recent Labs: 07/30/2018: Hemoglobin 16.8; Platelets 297; TSH 3.330 10/28/2018: ALT 72; BUN 5; Creatinine, Ser 0.81; Potassium 4.3; Sodium 142    Lipid Panel    Component Value Date/Time   CHOL 225 (H) 08/27/2018 1007   TRIG 245 (H)  08/27/2018 1007   HDL 40 08/27/2018 1007   CHOLHDL 5.6 (H) 08/27/2018 1007   LDLCALC 136 (H) 08/27/2018 1007      Wt Readings from Last 3 Encounters:  12/06/18 227 lb 9.6 oz (103.2 kg)  10/28/18 227 lb 6.4 oz (103.1 kg)  10/04/18 221 lb (100.2 kg)      Other studies Reviewed: Additional studies/ records that were reviewed today include:   Echocardiogram 10/04/2018: 1. Left ventricular ejection fraction, by visual estimation, is 60 to 65%. The left ventricle has normal function. Normal left ventricular size. There is no left ventricular hypertrophy.  2. Global right ventricle has normal systolic function.The right ventricular size is normal. No increase in right ventricular wall thickness.  3. Left atrial size was moderately dilated.  4. Right atrial size was normal.  5. The mitral valve is normal in structure. No evidence of mitral valve regurgitation. No evidence of mitral stenosis.  6. The tricuspid valve is normal in structure. Tricuspid valve regurgitation was not visualized by color flow Doppler.  7. The aortic valve is tricuspid Aortic valve regurgitation was not visualized by color flow Doppler. Mild aortic valve sclerosis without stenosis.  8. The pulmonic valve was normal in structure. Pulmonic valve regurgitation is not visualized by color flow Doppler.  9. Normal pulmonary artery systolic pressure. 10. The inferior vena cava is normal in size with greater than 50% respiratory variability, suggesting right atrial pressure of 3 mmHg.   ASSESSMENT AND PLAN:  1. HTN: BP elevated to 154/80 today, though did not take his medication this morning since he was fasting for blood work. We discussed maintaining a low sodium diet. - Encouraged him to keep a log over the next week and notify the office if persistently >130/80 - Continue losartan  2. Mixed HLD: LDL 136 and triglycerides 245 08/2018; goal LDL <100 - Will check FLP and LFT's today  - Continue crestor  3. DM type 2:  A1C significantly improved from 11 to 5.4 on metformin - Continue close outpatient monitoring with PCP/Endocrine for goal A1C <7  Encouraged aggressive risk factor modifications to prevent heart disease as above, as well as maintaining an active lifestyle.    Current medicines are reviewed at length with the patient today.  The patient does not have concerns regarding medicines.  The following changes have been made:  no change  Labs/ tests ordered today include:   Orders Placed This Encounter  Procedures  . Lipid panel     Disposition:   FU with Dr. Audie Box or myself in 1 year  Signed, Abigail Butts, PA-C  12/06/2018 4:06 PM

## 2018-12-07 LAB — HEPATIC FUNCTION PANEL
ALT: 65 IU/L — ABNORMAL HIGH (ref 0–44)
AST: 37 IU/L (ref 0–40)
Albumin: 4.9 g/dL (ref 4.0–5.0)
Alkaline Phosphatase: 132 IU/L — ABNORMAL HIGH (ref 39–117)
Bilirubin Total: 0.8 mg/dL (ref 0.0–1.2)
Bilirubin, Direct: 0.2 mg/dL (ref 0.00–0.40)
Total Protein: 7.2 g/dL (ref 6.0–8.5)

## 2018-12-07 LAB — LIPID PANEL
Chol/HDL Ratio: 2.8 ratio (ref 0.0–5.0)
Cholesterol, Total: 155 mg/dL (ref 100–199)
HDL: 56 mg/dL (ref >39)
LDL Chol Calc (NIH): 70 mg/dL (ref 0–99)
Triglycerides: 171 mg/dL — ABNORMAL HIGH (ref 0–149)
VLDL Cholesterol Cal: 29 mg/dL (ref 5–40)

## 2018-12-10 NOTE — Progress Notes (Signed)
The patient has been notified of the result and verbalized understanding.  All questions (if any) were answered. Austin Little, [name] 12/10/2018 5:15 PM

## 2018-12-20 ENCOUNTER — Ambulatory Visit: Payer: 59 | Admitting: Internal Medicine

## 2018-12-21 MED FILL — LOSARTAN POTASSIUM 25 MG TA: 25 | 30 days supply | Qty: 30 | Fill #4

## 2018-12-27 NOTE — Progress Notes (Signed)
Anesthesia Chart Review:  Recently established with cardiology to evaluate an abnormal EKG (non-specific ST-T wave abnormalities). He had no specific cardiac complaints at that time but was noted to have frequent PVCs on the EKG at that visit. He underwent an echocardiogram which revealed EF 60-65%, no LVH, and moderate LAE. He was last seen 12/06/18. Hepatic function panel and lipid panel were ordered at that time and both showed improvement. He was advised to f/u in 1 year.   Elevated LFTs, followed by PCP. First identified 7/20. At that time pt also diagnosed with DMII, A1c was 11.0 at time of diagnosis. He admitted to hx of heavy etoh use and poor diet. CT abdomen 08/02/18 showed fatty infiltration of the liver. Since that time pt has significantly improved his BG control, last A1c 5.4 on 10/28/18. His LFTs have also improved. Last labs 12/06/18 show AST 37 and ALT 65.   EKG 09/27/18: Sinus rhythm with frequent PVCs. Rate 94. Nonspecific T wave abnormality.   TTE 10/04/18:  1. Left ventricular ejection fraction, by visual estimation, is 60 to 65%. The left ventricle has normal function. Normal left ventricular size. There is no left ventricular hypertrophy.  2. Global right ventricle has normal systolic function.The right ventricular size is normal. No increase in right ventricular wall thickness.  3. Left atrial size was moderately dilated.  4. Right atrial size was normal.  5. The mitral valve is normal in structure. No evidence of mitral valve regurgitation. No evidence of mitral stenosis.  6. The tricuspid valve is normal in structure. Tricuspid valve regurgitation was not visualized by color flow Doppler.  7. The aortic valve is tricuspid Aortic valve regurgitation was not visualized by color flow Doppler. Mild aortic valve sclerosis without stenosis.  8. The pulmonic valve was normal in structure. Pulmonic valve regurgitation is not visualized by color flow Doppler.  9. Normal pulmonary  artery systolic pressure. 10. The inferior vena cava is normal in size with greater than 50% respiratory variability, suggesting right atrial pressure of 3 mmHg.   Wynonia Musty [facility] Short Stay Center/Anesthesiology Phone [phone] 01/06/2019 4:01 PM

## 2018-12-31 NOTE — Progress Notes (Signed)
[place], [place] [address] Phone: [phone] Fax: [phone]     Your procedure is scheduled on Tuesday, December 22nd, 2020.   Report to [facility] Main Entrance "A" at 11:00 A.M., and check in at the Admitting office.   Call this number if you have problems the morning of surgery:  [phone]  Call [phone] if you have any questions prior to your surgery date Monday-Friday 8am-4pm    Remember:  Do not eat after midnight the night before your surgery  You may drink clear liquids until 10:00AM the morning of your surgery.    Clear liquids allowed are: Water, Non-Citrus Juices (without pulp), Carbonated Beverages, Clear Tea, Black Coffee Only, and Gatorade    Take these medicines the morning of surgery with A SIP OF WATER :  Omeprazole (Prilosec) Rosuvastatin (Crestor)  WHAT DO I DO ABOUT MY DIABETES MEDICATION?  Marland Kitchen Do not take oral diabetes medicines (Metformin/Glucophage or Sitagliptin/Januvia) the morning of surgery.  HOW TO MANAGE YOUR DIABETES BEFORE AND AFTER SURGERY  Why is it important to control my blood sugar before and after surgery? . Improving blood sugar levels before and after surgery helps healing and can limit problems. . A way of improving blood sugar control is eating a healthy diet by: o  Eating less sugar and carbohydrates o  Increasing activity/exercise o  Talking with your doctor about reaching your blood sugar goals . High blood sugars (greater than 180 mg/dL) can raise your risk of infections and slow your recovery, so you will need to focus on controlling your diabetes during the weeks before surgery. . Make sure that the doctor who takes care of your diabetes knows about your planned surgery including the date and location.  How do I manage my blood sugar before surgery? . Check your blood sugar at least 4 times a day, starting 2 days before  surgery, to make sure that the level is not too high or low. . Check your blood sugar the morning of your surgery when you wake up and every 2 hours until you get to the Short Stay unit. o If your blood sugar is less than 70 mg/dL, you will need to treat for low blood sugar: - Do not take insulin. - Treat a low blood sugar (less than 70 mg/dL) with  cup of clear juice (cranberry or apple), 4 glucose tablets, OR glucose gel. - Recheck blood sugar in 15 minutes after treatment (to make sure it is greater than 70 mg/dL). If your blood sugar is not greater than 70 mg/dL on recheck, call [phone] for further instructions. . Report your blood sugar to the short stay nurse when you get to Short Stay.  . If you are admitted to the hospital after surgery: o Your blood sugar will be checked by the staff and you will probably be given insulin after surgery (instead of oral diabetes medicines) to make sure you have good blood sugar levels. o The goal for blood sugar control after surgery is 80-180 mg/dL.  7 days prior to surgery STOP taking any Aspirin (unless otherwise instructed by your surgeon), Aleve, Naproxen, Ibuprofen, Motrin, Advil, Goody's, BC's, all herbal medications, fish oil, and all vitamins.    The Morning of Surgery  Do not wear jewelry.  Do not wear lotions, powders, colognes, or deodorant  Do not shave 48 hours prior to surgery.  Men may shave face and neck.  Do not bring valuables to the hospital.  [facility] is not responsible for any belongings or valuables.  If you are a smoker, DO NOT Smoke 24 hours prior to surgery  If you wear a CPAP at night please bring your mask, tubing, and machine the morning of surgery   Remember that you must have someone to transport you home after your surgery, and remain with you for 24 hours if you are discharged the same day.   Please bring cases for contacts, glasses, hearing aids, dentures or bridgework because it cannot be worn into  surgery.    Leave your suitcase in the car.  After surgery it may be brought to your room.  For patients admitted to the hospital, discharge time will be determined by your treatment team.  Patients discharged the day of surgery will not be allowed to drive home.    Special instructions:   [facility]- Preparing For Surgery  Before surgery, you can play an important role. Because skin is not sterile, your skin needs to be as free of germs as possible. You can reduce the number of germs on your skin by washing with CHG (chlorahexidine gluconate) Soap before surgery.  CHG is an antiseptic cleaner which kills germs and bonds with the skin to continue killing germs even after washing.    Oral Hygiene is also important to reduce your risk of infection.  Remember - BRUSH YOUR TEETH THE MORNING OF SURGERY WITH YOUR REGULAR TOOTHPASTE  Please do not use if you have an allergy to CHG or antibacterial soaps. If your skin becomes reddened/irritated stop using the CHG.  Do not shave (including legs and underarms) for at least 48 hours prior to first CHG shower. It is OK to shave your face.  Please follow these instructions carefully.   1. Shower the NIGHT BEFORE SURGERY and the MORNING OF SURGERY with CHG Soap.   2. If you chose to wash your hair, wash your hair first as usual with your normal shampoo.  3. After you shampoo, rinse your hair and body thoroughly to remove the shampoo.  4. Use CHG as you would any other liquid soap. You can apply CHG directly to the skin and wash gently with a scrungie or a clean washcloth.   5. Apply the CHG Soap to your body ONLY FROM THE NECK DOWN.  Do not use on open wounds or open sores. Avoid contact with your eyes, ears, mouth and genitals (private parts). Wash Face and genitals (private parts)  with your normal soap.   6. Wash thoroughly, paying special attention to the area where your surgery will be performed.  7. Thoroughly rinse your body with warm  water from the neck down.  8. DO NOT shower/wash with your normal soap after using and rinsing off the CHG Soap.  9. Pat yourself dry with a CLEAN TOWEL.  10. Wear CLEAN PAJAMAS to bed the night before surgery, wear comfortable clothes the morning of surgery  11. Place CLEAN SHEETS on your bed the night of your first shower and DO NOT SLEEP WITH PETS.    Day of Surgery:  Please shower the morning of surgery with the CHG soap Do not apply any deodorants/lotions. Please wear clean clothes to the hospital/surgery center.   Remember to brush your teeth WITH YOUR REGULAR TOOTHPASTE.   Please read over the following fact sheets that you were given.

## 2019-01-01 ENCOUNTER — Inpatient Hospital Stay (HOSPITAL_COMMUNITY)
Admission: RE | Admit: 2019-01-01 | Discharge: 2019-01-01 | Disposition: A | Discharge status: Home / Self Care | Payer: 59 | Source: Ambulatory Visit

## 2019-01-01 ENCOUNTER — Encounter (HOSPITAL_BASED_OUTPATIENT_CLINIC_OR_DEPARTMENT_OTHER): Payer: Self-pay | Admitting: General Surgery

## 2019-01-01 ENCOUNTER — Other Ambulatory Visit: Payer: Self-pay

## 2019-01-03 ENCOUNTER — Other Ambulatory Visit: Payer: Self-pay

## 2019-01-03 ENCOUNTER — Other Ambulatory Visit (HOSPITAL_COMMUNITY)
Admission: RE | Admit: 2019-01-03 | Discharge: 2019-01-03 | Disposition: A | Discharge status: Home / Self Care | Payer: 59 | Source: Ambulatory Visit | Attending: General Surgery | Admitting: General Surgery

## 2019-01-03 ENCOUNTER — Encounter (HOSPITAL_BASED_OUTPATIENT_CLINIC_OR_DEPARTMENT_OTHER)
Admission: RE | Admit: 2019-01-03 | Discharge: 2019-01-03 | Disposition: A | Discharge status: Home / Self Care | Payer: 59 | Source: Ambulatory Visit | Attending: General Surgery | Admitting: General Surgery

## 2019-01-03 DIAGNOSIS — I97191 Other postprocedural cardiac functional disturbances following other surgery: Secondary | ICD-10-CM | POA: Diagnosis not present

## 2019-01-03 DIAGNOSIS — K219 Gastro-esophageal reflux disease without esophagitis: Secondary | ICD-10-CM | POA: Diagnosis not present

## 2019-01-03 DIAGNOSIS — F17290 Nicotine dependence, other tobacco product, uncomplicated: Secondary | ICD-10-CM | POA: Diagnosis not present

## 2019-01-03 DIAGNOSIS — Z79899 Other long term (current) drug therapy: Secondary | ICD-10-CM | POA: Diagnosis not present

## 2019-01-03 DIAGNOSIS — E7800 Pure hypercholesterolemia, unspecified: Secondary | ICD-10-CM | POA: Diagnosis not present

## 2019-01-03 DIAGNOSIS — K429 Umbilical hernia without obstruction or gangrene: Secondary | ICD-10-CM | POA: Diagnosis not present

## 2019-01-03 DIAGNOSIS — Z01812 Encounter for preprocedural laboratory examination: Secondary | ICD-10-CM | POA: Diagnosis not present

## 2019-01-03 DIAGNOSIS — Z20828 Contact with and (suspected) exposure to other viral communicable diseases: Secondary | ICD-10-CM | POA: Diagnosis not present

## 2019-01-03 DIAGNOSIS — I10 Essential (primary) hypertension: Secondary | ICD-10-CM | POA: Diagnosis not present

## 2019-01-03 DIAGNOSIS — E782 Mixed hyperlipidemia: Secondary | ICD-10-CM | POA: Diagnosis not present

## 2019-01-03 DIAGNOSIS — E1165 Type 2 diabetes mellitus with hyperglycemia: Secondary | ICD-10-CM | POA: Diagnosis not present

## 2019-01-03 DIAGNOSIS — Z7984 Long term (current) use of oral hypoglycemic drugs: Secondary | ICD-10-CM | POA: Diagnosis not present

## 2019-01-03 LAB — BASIC METABOLIC PANEL
Anion gap: 12 (ref 5–15)
BUN: 8 mg/dL (ref 6–20)
CO2: 26 mmol/L (ref 22–32)
Calcium: 9.2 mg/dL (ref 8.9–10.3)
Chloride: 100 mmol/L (ref 98–111)
Creatinine, Ser: 0.87 mg/dL (ref 0.61–1.24)
GFR calc Af Amer: >60 mL/min (ref >60)
GFR calc non Af Amer: >60 mL/min (ref >60)
Glucose, Bld: 110 mg/dL — ABNORMAL HIGH (ref 70–99)
Potassium: 5 mmol/L (ref 3.5–5.1)
Sodium: 138 mmol/L (ref 135–145)

## 2019-01-03 NOTE — Progress Notes (Signed)

## 2019-01-04 LAB — NOVEL CORONAVIRUS, NAA (HOSP ORDER, SEND-OUT TO REF LAB; TAT 18-24 HRS): SARS-CoV-2, NAA: NOT DETECTED

## 2019-01-06 NOTE — Anesthesia Preprocedure Evaluation (Addendum)
Anesthesia Evaluation  Patient identified by MRN, date of birth, ID band Patient awake    Reviewed: Allergy & Precautions, NPO status , Patient's Chart, lab work & pertinent test results  Airway Mallampati: II  TM Distance: >3 FB Neck ROM: Full    Dental no notable dental hx. (+) Teeth Intact, Dental Advisory Given   Pulmonary Current Smoker and Patient abstained from smoking.,    Pulmonary exam normal breath sounds clear to auscultation       Cardiovascular hypertension, Pt. on medications Normal cardiovascular exam Rhythm:Regular Rate:Normal     Neuro/Psych negative neurological ROS  negative psych ROS   GI/Hepatic Neg liver ROS, GERD  Medicated,  Endo/Other  diabetes, Type 2, Oral Hypoglycemic Agents  Renal/GU   negative genitourinary   Musculoskeletal negative musculoskeletal ROS (+)   Abdominal   Peds  Hematology negative hematology ROS (+)   Anesthesia Other Findings   Reproductive/Obstetrics                           Anesthesia Physical Anesthesia Plan  ASA: III  Anesthesia Plan: General   Post-op Pain Management:    Induction: Intravenous  PONV Risk Score and Plan: 2 and Treatment may vary due to age or medical condition, Ondansetron and Dexamethasone  Airway Management Planned: Oral ETT  Additional Equipment: None  Intra-op Plan:   Post-operative Plan: Extubation in OR  Informed Consent: I have reviewed the patients History and Physical, chart, labs and discussed the procedure including the risks, benefits and alternatives for the proposed anesthesia with the patient or authorized representative who has indicated his/her understanding and acceptance.     Dental advisory given  Plan Discussed with: CRNA  Anesthesia Plan Comments:        Anesthesia Quick Evaluation

## 2019-01-07 ENCOUNTER — Ambulatory Visit (HOSPITAL_BASED_OUTPATIENT_CLINIC_OR_DEPARTMENT_OTHER): Payer: 59 | Admitting: Physician Assistant

## 2019-01-07 ENCOUNTER — Encounter (HOSPITAL_COMMUNITY)
Admission: RE | Disposition: A | Discharge status: Home / Self Care | Payer: Self-pay | Source: Home / Self Care | Attending: Emergency Medicine

## 2019-01-07 ENCOUNTER — Other Ambulatory Visit: Payer: Self-pay

## 2019-01-07 ENCOUNTER — Ambulatory Visit (HOSPITAL_BASED_OUTPATIENT_CLINIC_OR_DEPARTMENT_OTHER)
Admission: RE | Admit: 2019-01-07 | Discharge: 2019-01-07 | Disposition: A | Discharge status: Home / Self Care | Payer: 59 | Attending: Emergency Medicine | Admitting: Emergency Medicine

## 2019-01-07 ENCOUNTER — Encounter (HOSPITAL_BASED_OUTPATIENT_CLINIC_OR_DEPARTMENT_OTHER): Payer: Self-pay | Admitting: General Surgery

## 2019-01-07 ENCOUNTER — Ambulatory Visit (HOSPITAL_COMMUNITY): Payer: 59

## 2019-01-07 DIAGNOSIS — F17290 Nicotine dependence, other tobacco product, uncomplicated: Secondary | ICD-10-CM | POA: Diagnosis not present

## 2019-01-07 DIAGNOSIS — I4891 Unspecified atrial fibrillation: Secondary | ICD-10-CM

## 2019-01-07 DIAGNOSIS — Z4889 Encounter for other specified surgical aftercare: Secondary | ICD-10-CM

## 2019-01-07 DIAGNOSIS — E1165 Type 2 diabetes mellitus with hyperglycemia: Secondary | ICD-10-CM | POA: Diagnosis not present

## 2019-01-07 DIAGNOSIS — Z7984 Long term (current) use of oral hypoglycemic drugs: Secondary | ICD-10-CM | POA: Diagnosis not present

## 2019-01-07 DIAGNOSIS — E7800 Pure hypercholesterolemia, unspecified: Secondary | ICD-10-CM | POA: Insufficient documentation

## 2019-01-07 DIAGNOSIS — I10 Essential (primary) hypertension: Secondary | ICD-10-CM | POA: Insufficient documentation

## 2019-01-07 DIAGNOSIS — K219 Gastro-esophageal reflux disease without esophagitis: Secondary | ICD-10-CM | POA: Diagnosis not present

## 2019-01-07 DIAGNOSIS — K429 Umbilical hernia without obstruction or gangrene: Secondary | ICD-10-CM | POA: Diagnosis not present

## 2019-01-07 DIAGNOSIS — E782 Mixed hyperlipidemia: Secondary | ICD-10-CM | POA: Insufficient documentation

## 2019-01-07 DIAGNOSIS — Z79899 Other long term (current) drug therapy: Secondary | ICD-10-CM | POA: Diagnosis not present

## 2019-01-07 DIAGNOSIS — I97191 Other postprocedural cardiac functional disturbances following other surgery: Secondary | ICD-10-CM | POA: Diagnosis not present

## 2019-01-07 DIAGNOSIS — R002 Palpitations: Secondary | ICD-10-CM | POA: Diagnosis not present

## 2019-01-07 DIAGNOSIS — E119 Type 2 diabetes mellitus without complications: Secondary | ICD-10-CM | POA: Diagnosis not present

## 2019-01-07 HISTORY — PX: UMBILICAL HERNIA REPAIR: SHX196

## 2019-01-07 LAB — URINALYSIS, ROUTINE W REFLEX MICROSCOPIC
Bilirubin Urine: NEGATIVE
Glucose, UA: NEGATIVE mg/dL
Hgb urine dipstick: NEGATIVE
Ketones, ur: NEGATIVE mg/dL
Leukocytes,Ua: NEGATIVE
Nitrite: NEGATIVE
Protein, ur: NEGATIVE mg/dL
Specific Gravity, Urine: 1.009 (ref 1.005–1.030)
pH: 6 (ref 5.0–8.0)

## 2019-01-07 LAB — COMPREHENSIVE METABOLIC PANEL
ALT: 82 U/L — ABNORMAL HIGH (ref 0–44)
AST: 43 U/L — ABNORMAL HIGH (ref 15–41)
Albumin: 4.1 g/dL (ref 3.5–5.0)
Alkaline Phosphatase: 119 U/L (ref 38–126)
Anion gap: 11 (ref 5–15)
BUN: 9 mg/dL (ref 6–20)
CO2: 24 mmol/L (ref 22–32)
Calcium: 8.9 mg/dL (ref 8.9–10.3)
Chloride: 100 mmol/L (ref 98–111)
Creatinine, Ser: 1.01 mg/dL (ref 0.61–1.24)
GFR calc Af Amer: >60 mL/min (ref >60)
GFR calc non Af Amer: >60 mL/min (ref >60)
Glucose, Bld: 188 mg/dL — ABNORMAL HIGH (ref 70–99)
Potassium: 4.9 mmol/L (ref 3.5–5.1)
Sodium: 135 mmol/L (ref 135–145)
Total Bilirubin: 1 mg/dL (ref 0.3–1.2)
Total Protein: 6.9 g/dL (ref 6.5–8.1)

## 2019-01-07 LAB — GLUCOSE, CAPILLARY
Glucose-Capillary: 148 mg/dL — ABNORMAL HIGH (ref 70–99)
Glucose-Capillary: 179 mg/dL — ABNORMAL HIGH (ref 70–99)

## 2019-01-07 LAB — CBC WITH DIFFERENTIAL/PLATELET
Abs Immature Granulocytes: 0.14 10*3/uL — ABNORMAL HIGH (ref 0.00–0.07)
Basophils Absolute: 0.1 10*3/uL (ref 0.0–0.1)
Basophils Relative: 0 %
Eosinophils Absolute: 0 10*3/uL (ref 0.0–0.5)
Eosinophils Relative: 0 %
HCT: 46.7 % (ref 39.0–52.0)
Hemoglobin: 16.3 g/dL (ref 13.0–17.0)
Immature Granulocytes: 1 %
Lymphocytes Relative: 7 %
Lymphs Abs: 1 10*3/uL (ref 0.7–4.0)
MCH: 31.3 pg (ref 26.0–34.0)
MCHC: 34.9 g/dL (ref 30.0–36.0)
MCV: 89.8 fL (ref 80.0–100.0)
Monocytes Absolute: 0.3 10*3/uL (ref 0.1–1.0)
Monocytes Relative: 2 %
Neutro Abs: 13 10*3/uL — ABNORMAL HIGH (ref 1.7–7.7)
Neutrophils Relative %: 90 %
Platelets: 377 10*3/uL (ref 150–400)
RBC: 5.2 MIL/uL (ref 4.22–5.81)
RDW: 11.9 % (ref 11.5–15.5)
WBC: 14.4 10*3/uL — ABNORMAL HIGH (ref 4.0–10.5)
nRBC: 0 % (ref 0.0–0.2)

## 2019-01-07 SURGERY — REPAIR, HERNIA, UMBILICAL, ADULT
Anesthesia: General

## 2019-01-07 MED ORDER — FENTANYL CITRATE (PF) 100 MCG/2ML IJ SOLN
INTRAMUSCULAR | Status: AC
  Filled 2019-01-07: qty 2

## 2019-01-07 MED ORDER — MIDAZOLAM HCL 2 MG/2ML IJ SOLN
INTRAMUSCULAR | Status: DC | PRN
  Administered 2019-01-07: 2 mg via INTRAVENOUS

## 2019-01-07 MED ORDER — DEXMEDETOMIDINE HCL 200 MCG/2ML IV SOLN
INTRAVENOUS | Status: DC | PRN
  Administered 2019-01-07 (×2): 4 ug via INTRAVENOUS
  Administered 2019-01-07: 12 ug via INTRAVENOUS
  Administered 2019-01-07: 4 ug via INTRAVENOUS

## 2019-01-07 MED ORDER — FENTANYL CITRATE (PF) 100 MCG/2ML IJ SOLN
25.0000 ug | INTRAMUSCULAR | Status: DC | PRN
  Administered 2019-01-07 (×3): 50 ug via INTRAVENOUS

## 2019-01-07 MED ORDER — HYDROCODONE-ACETAMINOPHEN 5-325 MG PO TABS: 1.0000 | ORAL_TABLET | Freq: Four times a day (QID) | ORAL | 0 refills | Status: DC | PRN

## 2019-01-07 MED ORDER — ONDANSETRON HCL 4 MG/2ML IJ SOLN
INTRAMUSCULAR | Status: AC
  Filled 2019-01-07: qty 2

## 2019-01-07 MED ORDER — FENTANYL CITRATE (PF) 250 MCG/5ML IJ SOLN
INTRAMUSCULAR | Status: DC | PRN
  Administered 2019-01-07 (×2): 50 ug via INTRAVENOUS
  Administered 2019-01-07: 100 ug via INTRAVENOUS

## 2019-01-07 MED ORDER — CHLORHEXIDINE GLUCONATE CLOTH 2 % EX PADS: 6.0000 | MEDICATED_PAD | Freq: Once | CUTANEOUS | Status: DC

## 2019-01-07 MED ORDER — CELECOXIB 200 MG PO CAPS
200.0000 mg | ORAL_CAPSULE | ORAL | Status: AC
  Administered 2019-01-07: 200 mg via ORAL

## 2019-01-07 MED ORDER — MIDAZOLAM HCL 2 MG/2ML IJ SOLN: 1.0000 mg | INTRAMUSCULAR | Status: DC | PRN

## 2019-01-07 MED ORDER — LACTATED RINGERS IV SOLN: INTRAVENOUS | Status: DC | PRN

## 2019-01-07 MED ORDER — MEPERIDINE HCL 25 MG/ML IJ SOLN: 6.2500 mg | INTRAMUSCULAR | Status: DC | PRN

## 2019-01-07 MED ORDER — DEXAMETHASONE SODIUM PHOSPHATE 10 MG/ML IJ SOLN
INTRAMUSCULAR | Status: DC | PRN
  Administered 2019-01-07: 10 mg via INTRAVENOUS

## 2019-01-07 MED ORDER — ALBUTEROL SULFATE HFA 108 (90 BASE) MCG/ACT IN AERS
INHALATION_SPRAY | RESPIRATORY_TRACT | Status: DC | PRN
  Administered 2019-01-07: 4 via RESPIRATORY_TRACT

## 2019-01-07 MED ORDER — ACETAMINOPHEN 10 MG/ML IV SOLN
INTRAVENOUS | Status: AC
  Filled 2019-01-07: qty 100

## 2019-01-07 MED ORDER — OXYCODONE HCL 5 MG/5ML PO SOLN: 5.0000 mg | Freq: Once | ORAL | Status: DC | PRN

## 2019-01-07 MED ORDER — DEXAMETHASONE SODIUM PHOSPHATE 10 MG/ML IJ SOLN
INTRAMUSCULAR | Status: AC
  Filled 2019-01-07: qty 1

## 2019-01-07 MED ORDER — SUGAMMADEX SODIUM 200 MG/2ML IV SOLN
INTRAVENOUS | Status: DC | PRN
  Administered 2019-01-07: 250 mg via INTRAVENOUS

## 2019-01-07 MED ORDER — CEFAZOLIN SODIUM-DEXTROSE 2-4 GM/100ML-% IV SOLN
2.0000 g | INTRAVENOUS | Status: AC
  Administered 2019-01-07: 2 g via INTRAVENOUS

## 2019-01-07 MED ORDER — ACETAMINOPHEN 500 MG PO TABS
1000.0000 mg | ORAL_TABLET | ORAL | Status: AC
  Administered 2019-01-07: 1000 mg via ORAL

## 2019-01-07 MED ORDER — ACETAMINOPHEN 10 MG/ML IV SOLN: 1000.0000 mg | Freq: Four times a day (QID) | INTRAVENOUS | Status: DC

## 2019-01-07 MED ORDER — LABETALOL HCL 5 MG/ML IV SOLN
5.0000 mg | Freq: Once | INTRAVENOUS | Status: AC
  Administered 2019-01-07: 14:00:00 5 mg via INTRAVENOUS

## 2019-01-07 MED ORDER — FENTANYL CITRATE (PF) 100 MCG/2ML IJ SOLN: 50.0000 ug | INTRAMUSCULAR | Status: DC | PRN

## 2019-01-07 MED ORDER — ACETAMINOPHEN 10 MG/ML IV SOLN
1000.0000 mg | Freq: Once | INTRAVENOUS | Status: DC | PRN
  Administered 2019-01-07: 14:00:00 1000 mg via INTRAVENOUS

## 2019-01-07 MED ORDER — ONDANSETRON HCL 4 MG/2ML IJ SOLN: 4.0000 mg | Freq: Once | INTRAMUSCULAR | Status: DC | PRN

## 2019-01-07 MED ORDER — VERAPAMIL HCL 2.5 MG/ML IV SOLN
2.5000 mg | Freq: Once | INTRAVENOUS | Status: AC
  Administered 2019-01-07: 15:00:00 2.5 mg via INTRAVENOUS

## 2019-01-07 MED ORDER — PROPOFOL 10 MG/ML IV BOLUS
INTRAVENOUS | Status: DC | PRN
  Administered 2019-01-07: 150 mg via INTRAVENOUS
  Administered 2019-01-07: 50 mg via INTRAVENOUS

## 2019-01-07 MED ORDER — LIDOCAINE 2% (20 MG/ML) 5 ML SYRINGE
INTRAMUSCULAR | Status: DC | PRN
  Administered 2019-01-07: 100 mg via INTRAVENOUS

## 2019-01-07 MED ORDER — GABAPENTIN 300 MG PO CAPS
ORAL_CAPSULE | ORAL | Status: AC
  Filled 2019-01-07: qty 1

## 2019-01-07 MED ORDER — CELECOXIB 200 MG PO CAPS
ORAL_CAPSULE | ORAL | Status: AC
  Filled 2019-01-07: qty 1

## 2019-01-07 MED ORDER — GABAPENTIN 300 MG PO CAPS
300.0000 mg | ORAL_CAPSULE | ORAL | Status: AC
  Administered 2019-01-07: 12:00:00 300 mg via ORAL

## 2019-01-07 MED ORDER — CEFAZOLIN SODIUM-DEXTROSE 2-4 GM/100ML-% IV SOLN
INTRAVENOUS | Status: AC
  Filled 2019-01-07: qty 100

## 2019-01-07 MED ORDER — ROCURONIUM BROMIDE 10 MG/ML (PF) SYRINGE
PREFILLED_SYRINGE | INTRAVENOUS | Status: DC | PRN
  Administered 2019-01-07: 20 mg via INTRAVENOUS
  Administered 2019-01-07: 50 mg via INTRAVENOUS

## 2019-01-07 MED ORDER — ACETAMINOPHEN 500 MG PO TABS
ORAL_TABLET | ORAL | Status: AC
  Filled 2019-01-07: qty 2

## 2019-01-07 MED ORDER — BUPIVACAINE HCL (PF) 0.25 % IJ SOLN
INTRAMUSCULAR | Status: DC | PRN
  Administered 2019-01-07: 10 mL

## 2019-01-07 MED ORDER — LACTATED RINGERS IV SOLN: INTRAVENOUS | Status: DC

## 2019-01-07 MED ORDER — OXYCODONE HCL 5 MG PO TABS: 5.0000 mg | ORAL_TABLET | Freq: Once | ORAL | Status: DC | PRN

## 2019-01-07 MED ORDER — ONDANSETRON HCL 4 MG/2ML IJ SOLN
INTRAMUSCULAR | Status: DC | PRN
  Administered 2019-01-07: 4 mg via INTRAVENOUS

## 2019-01-07 MED ORDER — MIDAZOLAM HCL 2 MG/2ML IJ SOLN
INTRAMUSCULAR | Status: AC
  Filled 2019-01-07: qty 2

## 2019-01-07 MED ORDER — SODIUM CHLORIDE 0.9 % IV BOLUS
500.0000 mL | Freq: Once | INTRAVENOUS | Status: AC
  Administered 2019-01-07: 17:00:00 500 mL via INTRAVENOUS

## 2019-01-07 MED ORDER — LABETALOL HCL 5 MG/ML IV SOLN
INTRAVENOUS | Status: AC
  Filled 2019-01-07: qty 4

## 2019-01-07 MED ORDER — FENTANYL CITRATE (PF) 100 MCG/2ML IJ SOLN
50.0000 ug | INTRAMUSCULAR | Status: DC | PRN
  Administered 2019-01-07 (×4): 50 ug via INTRAVENOUS
  Filled 2019-01-07 (×5): qty 2

## 2019-01-07 MED FILL — HYDROCODON-APAP 5-325: 5-325 | 3 days supply | Qty: 20 | Fill #0

## 2019-01-07 SURGICAL SUPPLY — 31 items
BLADE CLIPPER SURG (BLADE) ×2 IMPLANT
BLADE SURG 15 STRL LF DISP TIS (BLADE) ×1 IMPLANT
BLADE SURG 15 STRL SS (BLADE) ×2
CHLORAPREP W/TINT 26 (MISCELLANEOUS) ×3 IMPLANT
COVER BACK TABLE REUSABLE LG (DRAPES) ×3 IMPLANT
COVER MAYO STAND REUSABLE (DRAPES) ×3 IMPLANT
DERMABOND ADVANCED (GAUZE/BANDAGES/DRESSINGS) ×2
DERMABOND ADVANCED .7 DNX12 (GAUZE/BANDAGES/DRESSINGS) ×1 IMPLANT
DRAPE LAPAROTOMY TRNSV 102X78 (DRAPES) ×3 IMPLANT
DRAPE UTILITY XL STRL (DRAPES) ×3 IMPLANT
ELECT COATED BLADE 2.86 ST (ELECTRODE) ×3 IMPLANT
ELECT REM PT RETURN 9FT ADLT (ELECTROSURGICAL) ×3
ELECTRODE REM PT RTRN 9FT ADLT (ELECTROSURGICAL) ×1 IMPLANT
GLOVE BIO SURGEON STRL SZ7.5 (GLOVE) ×3 IMPLANT
GOWN STRL REUS W/ TWL LRG LVL3 (GOWN DISPOSABLE) ×2 IMPLANT
GOWN STRL REUS W/TWL LRG LVL3 (GOWN DISPOSABLE) ×4
MESH VENTRALEX ST 2.5 CRC MED (Mesh General) ×2 IMPLANT
NDL HYPO 25X1 1.5 SAFETY (NEEDLE) ×1 IMPLANT
NEEDLE HYPO 22GX1.5 SAFETY (NEEDLE) ×2 IMPLANT
NEEDLE HYPO 25X1 1.5 SAFETY (NEEDLE) ×3 IMPLANT
NS IRRIG 1000ML POUR BTL (IV SOLUTION) ×2 IMPLANT
PACK BASIN DAY SURGERY FS (CUSTOM PROCEDURE TRAY) ×3 IMPLANT
PENCIL SMOKE EVACUATOR (MISCELLANEOUS) ×3 IMPLANT
SLEEVE SCD COMPRESS KNEE MED (MISCELLANEOUS) ×2 IMPLANT
SPONGE LAP 18X18 RF (DISPOSABLE) ×3 IMPLANT
SUT MON AB 4-0 PC3 18 (SUTURE) ×3 IMPLANT
SUT NOVA 0 T19/GS 22DT (SUTURE) ×4 IMPLANT
SUT VIC AB 2-0 SH 27 (SUTURE) ×2
SUT VIC AB 2-0 SH 27XBRD (SUTURE) ×1 IMPLANT
SYR CONTROL 10ML LL (SYRINGE) ×3 IMPLANT
TOWEL GREEN STERILE FF (TOWEL DISPOSABLE) ×6 IMPLANT

## 2019-01-07 NOTE — Transfer of Care (Signed)
Immediate Anesthesia Transfer of Care Note  Patient: Austin Little  Procedure(s) Performed: UMBILICAL HERNIA REPAIR WITH MESH (N/A )  Patient Location: PACU  Anesthesia Type:General  Level of Consciousness: awake, alert  and oriented  Airway & Oxygen Therapy: Patient Spontanous Breathing and Patient connected to face mask oxygen  Post-op Assessment: Report given to RN and Post -op Vital signs reviewed and stable  Post vital signs: Reviewed and stable  Last Vitals:  Vitals Value Taken Time  BP 124/85 01/07/19 1350  Temp    Pulse 44 01/07/19 1354  Resp 29 01/07/19 1354  SpO2 95 % 01/07/19 1354  Vitals shown include unvalidated device data.  Last Pain:  Vitals:   01/07/19 1135  TempSrc: Temporal  PainSc: 0-No pain         Complications: Dr. Valma Cava aware of pt's heart rate. MD at bedside.

## 2019-01-07 NOTE — Progress Notes (Signed)
Report called to charge nurse at [facility] ER and report given to [place]

## 2019-01-07 NOTE — ED Notes (Signed)
Surgeon in to see pt. C/o pain at surgical site. Dr Eulis Foster notified, order given for fentanyl

## 2019-01-07 NOTE — ED Notes (Signed)
Patient Alert and oriented to baseline. Stable and ambulatory to baseline. Patient verbalized understanding of the discharge instructions.  Patient belongings were taken by the patient.   

## 2019-01-07 NOTE — ED Provider Notes (Signed)
[facility] EMERGENCY DEPARTMENT Provider Note   CSN: [id number] Arrival date & time: 01/07/19  1608     History Chief Complaint  Patient presents with  . Atrial Fibrillation    Austin Little is a 38 y.o. male.  HPI   Patient presents from the day surgery after episode of atrial fibrillation postoperative, following right groin hernia repair.  He was treated with a small dose of labetalol and 2 small doses of verapamil, without relief.  After that he was transferred here for evaluation.  On arrival, patient is comfortable and does not have chest pain and has a sensation of mild tachycardia.  He feels like he has to void.  He did not take his usual medicines this morning because of the pending surgery.  He does not take anything for cardiac rate control.  No prior history of atrial fibrillation.  No prior illnesses.  He states he tested negative for COVID-19, swabbed on 01/03/2019.  There are no other known modifying factors.    Past Medical History:  Diagnosis Date  . Diabetes mellitus without complication (HCC)   . GERD (gastroesophageal reflux disease)   . Hypertension     Patient Active Problem List   Diagnosis Date Noted  . Essential hypertension 08/27/2018  . Fatty liver, alcoholic 08/27/2018  . Abnormal LFTs 08/27/2018  . Mixed hyperlipidemia 08/27/2018  . Light smoker 08/27/2018  . Alcohol use disorder, mild, abuse 08/27/2018  . Umbilical hernia without obstruction and without gangrene 08/27/2018  . Type 2 diabetes mellitus with hyperglycemia, without long-term current use of insulin (HCC) 08/01/2018    Past Surgical History:  Procedure Laterality Date  . NO PAST SURGERIES         Family History  Problem Relation Age of Onset  . Hypertension Father   . Heart attack Father   . Diabetes Maternal Grandmother   . Diabetes Paternal Grandmother   . Hypertension Paternal Grandfather   . Heart attack Paternal Grandfather   . Diabetes Mother    . Heart attack Paternal Uncle     Social History   Tobacco Use  . Smoking status: Light Tobacco Smoker    Types: Cigars  . Smokeless tobacco: Never Used  Substance Use Topics  . Alcohol use: Yes    Comment: occas  . Drug use: Never    Home Medications Prior to Admission medications   Medication Sig Start Date End Date Taking? Authorizing Provider  FREESTYLE LITE test strip  08/01/18  Yes [provider]  Lancets (FREESTYLE) lancets  08/01/18  Yes [provider]  losartan (COZAAR) 25 MG tablet Take 1 tablet (25 mg total) by mouth daily. 08/01/18  Yes Bing Neighbors, FNP  metFORMIN (GLUCOPHAGE) 1000 MG tablet Take 1 tablet (1,000 mg total) by mouth 2 (two) times daily with a meal. 08/01/18  Yes Bing Neighbors, FNP  omeprazole (PRILOSEC OTC) 20 MG tablet Take 20 mg by mouth daily.   Yes [provider]  rosuvastatin (CRESTOR) 40 MG tablet Take 40 mg by mouth daily.   Yes [provider]  sitaGLIPtin (JANUVIA) 100 MG tablet Take 1 tablet (100 mg total) by mouth daily. 10/31/18  Yes Fulp, Cammie, MD  HYDROcodone-acetaminophen (NORCO/VICODIN) 5-325 MG tablet Take 1-2 tablets by mouth every 6 (six) hours as needed for moderate pain or severe pain. 01/07/19   Griselda Miner, MD    Allergies    Patient has no known allergies.  Review of Systems  Review of Systems  All other systems reviewed and are negative.   Physical Exam Updated Vital Signs BP (!) 132/99   Pulse (!) 144   Temp 98.7 F (37.1 C)   Resp 17   Ht 6\' 3"  (1.905 m)   Wt 103.9 kg   SpO2 95%   BMI 28.63 kg/m   Physical Exam  ED Results / Procedures / Treatments   Labs (all labs ordered are listed, but only abnormal results are displayed) Labs Reviewed  BASIC METABOLIC PANEL - Abnormal; Notable for the following components:      Result Value   Glucose, Bld 110 (*)    All other components within normal limits  GLUCOSE, CAPILLARY - Abnormal; Notable for the following  components:   Glucose-Capillary 148 (*)    All other components within normal limits  GLUCOSE, CAPILLARY - Abnormal; Notable for the following components:   Glucose-Capillary 179 (*)    All other components within normal limits  CBC WITH DIFFERENTIAL/PLATELET - Abnormal; Notable for the following components:   WBC 14.4 (*)    Neutro Abs 13.0 (*)    Abs Immature Granulocytes 0.14 (*)    All other components within normal limits  COMPREHENSIVE METABOLIC PANEL - Abnormal; Notable for the following components:   Glucose, Bld 188 (*)    AST 43 (*)    ALT 82 (*)    All other components within normal limits  URINALYSIS, ROUTINE W REFLEX MICROSCOPIC - Abnormal; Notable for the following components:   Color, Urine STRAW (*)    All other components within normal limits  SARS CORONAVIRUS 2 (TAT 6-24 HRS)     Date: Today. 14:33  Rate: 144  Rhythm: atrial fibrillation  QRS Axis: normal  PR and QT Intervals: Normal QT  ST/T Wave abnormalities: normal  PR and QRS Conduction Disutrbances:none  Narrative Interpretation:   Old EKG Reviewed: none available  EKG EKG Interpretation  Date/Time:  Tuesday January 07 2019 19:22:26 EST Ventricular Rate:  97 PR Interval:    QRS Duration: 96 QT Interval:  340 QTC Calculation: 432 R Axis:   41 Text Interpretation: Sinus rhythm Since last tracing of earlier today Atrial fibrillation resolved Confirmed by Mancel BaleWentz, Anah Billard [phone]([id number]) on 01/07/2019 7:37:30 PM     Radiology DG Chest Port 1 View  Result Date: 01/07/2019 CLINICAL DATA:  Chest palpitations. EXAM: PORTABLE CHEST 1 VIEW COMPARISON:  None. FINDINGS: Lungs are clear. Heart size is normal. No pneumothorax or pleural effusion. No acute or focal bony abnormality. IMPRESSION: No acute disease. Electronically Signed   By: Drusilla Kannerhomas  Dalessio M.D.   On: 01/07/2019 17:06    Procedures .Critical Care Performed by: Mancel BaleWentz, Katelyne Galster, MD Authorized by: Mancel BaleWentz, Destin Vinsant, MD   Critical care provider  statement:    Critical care time (minutes):  35   Critical care start time:  01/07/2019 4:20 PM   Critical care end time:  01/07/2019 7:39 PM   Critical care time was exclusive of:  Separately billable procedures and treating other patients   Critical care was necessary to treat or prevent imminent or life-threatening deterioration of the following conditions:  Cardiac failure   Critical care was time spent personally by me on the following activities:  Blood draw for specimens, development of treatment plan with patient or surrogate, discussions with consultants, evaluation of patient's response to treatment, examination of patient, obtaining history from patient or surrogate, ordering and performing treatments and interventions, ordering and review of laboratory studies, pulse oximetry, re-evaluation of patient's  condition, review of old charts and ordering and review of radiographic studies   (including critical care time)  Medications Ordered in ED Medications  fentaNYL (SUBLIMAZE) injection 50 mcg (50 mcg Intravenous Given 01/07/19 1909)  acetaminophen (TYLENOL) tablet 1,000 mg (1,000 mg Oral Given 01/07/19 1145)  ceFAZolin (ANCEF) IVPB 2g/100 mL premix (2 g Intravenous Given 01/07/19 1301)  celecoxib (CELEBREX) capsule 200 mg (200 mg Oral Given 01/07/19 1146)  gabapentin (NEURONTIN) capsule 300 mg (300 mg Oral Given 01/07/19 1146)  labetalol (NORMODYNE) injection 5 mg (5 mg Intravenous Given 01/07/19 1405)  verapamil (ISOPTIN) injection 2.5 mg (2.5 mg Intravenous Given 01/07/19 1453)  verapamil (ISOPTIN) injection 2.5 mg (2.5 mg Intravenous Given 01/07/19 1501)  sodium chloride 0.9 % bolus 500 mL (500 mLs Intravenous New Bag/Given 01/07/19 1702)    ED Course  I have reviewed the triage vital signs and the nursing notes.  Pertinent labs & imaging results that were available during my care of the patient were reviewed by me and considered in my medical decision making (see chart for  details).  Clinical Course as of Jan 06 1953  Tue Jan 07, 2019  1919 Normal  Urinalysis, Routine w reflex microscopic(!) [EW]  1919 Normal except white count 14.4  CBC with Differential(!) [EW]  1919 Normal except glucose high, AST high, ALT high  Comprehensive metabolic panel(!) [EW]  7846 No CHF or infiltrate, image reviewed and interpreted by me  DG Chest Port 1 View [EW]    Clinical Course User Index [EW] Daleen Bo, MD   MDM Rules/Calculators/A&P     CHA2DS2-VASc Score: 2                  Patient Vitals for the past 24 hrs:  BP Temp Temp src Pulse Resp SpO2 Height Weight  01/07/19 1900 (!) 132/99 -- -- -- 17 95 % -- --  01/07/19 1815 128/84 -- -- -- 16 94 % -- --  01/07/19 1730 121/85 -- -- -- 15 -- -- --  01/07/19 1545 111/84 -- -- (!) 144 18 94 % -- --  01/07/19 1531 (!) 117/93 -- -- (!) 133 18 94 % -- --  01/07/19 1530 -- -- -- (!) 136 19 95 % -- --  01/07/19 1515 103/88 -- -- (!) 118 (!) 26 96 % -- --  01/07/19 1501 94/78 -- -- -- -- -- -- --  01/07/19 1500 94/78 -- -- (!) 109 17 99 % -- --  01/07/19 1453 117/85 -- -- -- -- -- -- --  01/07/19 1445 117/85 -- -- (!) 138 17 99 % -- --  01/07/19 1430 99/74 -- -- (!) 141 18 98 % -- --  01/07/19 1415 92/67 -- -- (!) 38 18 92 % -- --  01/07/19 1409 96/82 -- -- (!) 143 (!) 48 96 % -- --  01/07/19 1400 -- -- -- 64 (!) 21 93 % -- --  01/07/19 1350 124/85 98.7 F (37.1 C) -- (!) 139 (!) 28 96 % -- --  01/07/19 1135 (!) 138/92 98 F (36.7 C) Temporal (!) 103 18 98 % 6\' 3"  (1.905 m) 103.9 kg    7:54 PM Reevaluation with update and discussion. After initial assessment and treatment, an updated evaluation reveals he is comfortable, hungry, with heart rate 92 which appears to be sinus rhythm on the monitor.  Will check twelve-lead EKG.  Patient will be allowed to eat at this time.Daleen Bo   Medical Decision Making: Patient with postoperative atrial  fibrillation with rapid response.  Patient improved after period of  observation, treatment with IV fluids, and analgesia.  CHADS2 score elevated at 2.  Patient not currently in atrial fibrillation therefore does not require anyi- coagulation start at this time.  Hemodynamically stable.  He is candidate for discharge to outpatient management.  Since he is at risk he will be referred to cardiology, for further evaluation as an outpatient.  Austin Little was evaluated in Emergency Department on 01/07/2019 for the symptoms described in the history of present illness. He was evaluated in the context of the global COVID-19 pandemic, which necessitated consideration that the patient might be at risk for infection with the SARS-CoV-2 virus that causes COVID-19. Institutional protocols and algorithms that pertain to the evaluation of patients at risk for COVID-19 are in a state of rapid change based on information released by regulatory bodies including the CDC and federal and state organizations. These policies and algorithms were followed during the patient's care in the ED.  CRITICAL CARE-yes Performed by: Mancel Bale  Nursing Notes Reviewed/ Care Coordinated Applicable Imaging Reviewed Interpretation of Laboratory Data incorporated into ED treatment  The patient appears reasonably screened and/or stabilized for discharge and I doubt any other medical condition or other [facility] requiring further screening, evaluation, or treatment in the ED at this time prior to discharge.  Plan: Home Medications-continue usual; Home Treatments-rest, fluids; return here if the recommended treatment, does not improve the symptoms; Recommended follow up-cardiology follow-up 1 to 2 weeks.  Surgery follow-up as planned.  Return here if needed    Final Clinical Impression(s) / ED Diagnoses Final diagnoses:  Atrial fibrillation with RVR (HCC)  Encounter for postoperative wound check    Rx / DC Orders ED Discharge Orders         Ordered    Diet - low sodium heart healthy     01/07/19  1342    Increase activity slowly     01/07/19 1342    Discharge instructions    Comments: May shower.  Diet as tolerated.  No heavy lifting.   01/07/19 1342    No wound care     01/07/19 1342    Call MD for:  temperature >100.4     01/07/19 1342    Call MD for:  persistant nausea and vomiting     01/07/19 1342    Call MD for:  severe uncontrolled pain     01/07/19 1342    Call MD for:  redness, tenderness, or signs of infection (pain, swelling, redness, odor or green/yellow discharge around incision site)     01/07/19 1342    Call MD for:  difficulty breathing, headache or visual disturbances     01/07/19 1342    Call MD for:  hives     01/07/19 1342    Call MD for:  persistant dizziness or light-headedness     01/07/19 1342    Call MD for:  extreme fatigue     01/07/19 1342    HYDROcodone-acetaminophen (NORCO/VICODIN) 5-325 MG tablet  Every 6 hours PRN     01/07/19 1342           Mancel Bale, MD 01/07/19 1954

## 2019-01-07 NOTE — ED Notes (Signed)
Wife notified that pt is ready to be discharged

## 2019-01-07 NOTE — Anesthesia Postprocedure Evaluation (Signed)
Anesthesia Post Note  Patient: Austin Little  Procedure(s) Performed: UMBILICAL HERNIA REPAIR WITH MESH (N/A )     Patient location during evaluation: PACU Anesthesia Type: General Level of consciousness: awake and alert Pain management: pain level controlled Vital Signs Assessment: post-procedure vital signs reviewed and stable Respiratory status: spontaneous breathing, nonlabored ventilation, respiratory function stable and patient connected to nasal cannula oxygen Cardiovascular status: tachycardic (Pt W New onset Afib post op. Denies palpitations , Chest pain or SOB.  EKG in Pacu   showed  afib r 140s w RVR.   Cards consulted Dr Radford Pax, and patient to be evaluated in ED by Her..  D/w patients wife. ) Postop Assessment: no apparent nausea or vomiting Anesthetic complications: no    Last Vitals:  Vitals:   01/07/19 1531 01/07/19 1545  BP: (!) 117/93 111/84  Pulse: (!) 133 (!) 144  Resp: 18 18  Temp:    SpO2: 94% 94%    Last Pain:  Vitals:   01/07/19 1545  TempSrc:   PainSc: 5                  Barnet Glasgow

## 2019-01-07 NOTE — Discharge Instructions (Addendum)
You had an episode of atrial fibrillation, following the surgery today.  To help prevent this from happening again, stay well-hydrated.  He will help to avoid alcohol products, as well.  He had mild elevation of 2 of your hepatic markers, which can indicate some mild inflammation.  Follow-up with your primary care doctor in a week or 2 for a general checkup.  Follow-up with the general surgeon, Dr. Marlou Starks for a checkup in 3 weeks.  Call his office for an appointment.  For the atrial fibrillation episode, call the cardiologist, Dr. Marlou Porch for a follow-up appointment in a couple of weeks.  Return here, if needed, for problems.

## 2019-01-07 NOTE — ED Triage Notes (Signed)
Pt bib ems from day surgery where he had a hernia repair today. Pt then went into afib rvr.

## 2019-01-07 NOTE — H&P (Signed)
Austin Little  Location: [facility] Patient #: [id number] DOB: 07/29/1980 Married / Language: English / Race: White Male   History of Present Illness  The patient is a 38 year old male who presents with abdominal swelling. We are asked to see the patient in consultation by Dr. Joaquin Courts to evaluate him for an umbilical hernia. The patient is a 38 year old white male who has noticed a bulge at his umbilicus for about the last 2 years. He has some mild discomfort associated with it. He denies any acute pain. He denies any nausea or vomiting. He denies any fevers or chills. His appetite is good and his bowels are working normally.   Diagnostic Studies History  Colonoscopy  never  Allergies  No Known Drug Allergies   Medication History Januvia (100MG  Tablet, Oral) Active. Losartan Potassium (25MG  Tablet, Oral) Active. Omeprazole (20MG  Capsule DR, Oral) Active. Crestor (40MG  Tablet, Oral) Active. Medications Reconciled  Social History  Alcohol use  Moderate alcohol use. Caffeine use  Coffee. Tobacco use  Current some day smoker.  Other Problems  Diabetes Mellitus  Gastroesophageal Reflux Disease  Hypercholesterolemia  Kidney Stone     Review of Systems  General Not Present- Appetite Loss, Chills, Fatigue, Fever, Night Sweats, Weight Gain and Weight Loss. Skin Not Present- Change in Wart/Mole, Dryness, Hives, Jaundice, New Lesions, Non-Healing Wounds, Rash and Ulcer. HEENT Not Present- Earache, Hearing Loss, Hoarseness, Nose Bleed, Oral Ulcers, Ringing in the Ears, Seasonal Allergies, Sinus Pain, Sore Throat, Visual Disturbances, Wears glasses/contact lenses and Yellow Eyes. Respiratory Not Present- Bloody sputum, Chronic Cough, Difficulty Breathing, Snoring and Wheezing. Breast Not Present- Breast Mass, Breast Pain, Nipple Discharge and Skin Changes. Cardiovascular Not Present- Chest Pain, Difficulty Breathing Lying Down, Leg Cramps,  Palpitations, Rapid Heart Rate, Shortness of Breath and Swelling of Extremities. Gastrointestinal Not Present- Abdominal Pain, Bloating, Bloody Stool, Change in Bowel Habits, Chronic diarrhea, Constipation, Difficulty Swallowing, Excessive gas, Gets full quickly at meals, Hemorrhoids, Indigestion, Nausea, Rectal Pain and Vomiting. Male Genitourinary Not Present- Blood in Urine, Change in Urinary Stream, Frequency, Impotence, Nocturia, Painful Urination, Urgency and Urine Leakage. Musculoskeletal Not Present- Back Pain, Joint Pain, Joint Stiffness, Muscle Pain, Muscle Weakness and Swelling of Extremities. Neurological Not Present- Decreased Memory, Fainting, Headaches, Numbness, Seizures, Tingling, Tremor, Trouble walking and Weakness. Psychiatric Not Present- Anxiety, Bipolar, Change in Sleep Pattern, Depression, Fearful and Frequent crying. Endocrine Present- New Diabetes. Not Present- Cold Intolerance, Excessive Hunger, Hair Changes, Heat Intolerance and Hot flashes.  Vitals  Weight: 224 lb Height: 75in Body Surface Area: 2.3 m Body Mass Index: 28 kg/m  Temp.: 98.73F  Pulse: 89 (Regular)  BP: 128/82 (Sitting, Left Arm, Standard)       Physical Exam  General Mental Status-Alert. General Appearance-Consistent with stated age. Hydration-Well hydrated. Voice-Normal.  Head and Neck Head-normocephalic, atraumatic with no lesions or palpable masses. Trachea-midline. Thyroid Gland Characteristics - normal size and consistency.  Eye Eyeball - Bilateral-Extraocular movements intact. Sclera/Conjunctiva - Bilateral-No scleral icterus.  Chest and Lung Exam Chest and lung exam reveals -quiet, even and easy respiratory effort with no use of accessory muscles and on auscultation, normal breath sounds, no adventitious sounds and normal vocal resonance. Inspection Chest Wall - Normal. Back - normal.  Cardiovascular Cardiovascular examination reveals -normal  heart sounds, regular rate and rhythm with no murmurs and normal pedal pulses bilaterally.  Abdomen Note: The abdomen is soft and nontender. There is a moderate sized reducible bulge at the umbilicus.   Neurologic Neurologic evaluation reveals -  alert and oriented x 3 with no impairment of recent or remote memory. Mental Status-Normal.  Musculoskeletal Normal Exam - Left-Upper Extremity Strength Normal and Lower Extremity Strength Normal. Normal Exam - Right-Upper Extremity Strength Normal and Lower Extremity Strength Normal.  Lymphatic Head & Neck  General Head & Neck Lymphatics: Bilateral - Description - Normal. Axillary  General Axillary Region: Bilateral - Description - Normal. Tenderness - Non Tender. Femoral & Inguinal  Generalized Femoral & Inguinal Lymphatics: Bilateral - Description - Normal. Tenderness - Non Tender.    Assessment & Plan  UMBILICAL HERNIA WITHOUT OBSTRUCTION OR GANGRENE (K42.9) Impression: The patient appears to have a moderate sized umbilical hernia. Because of the risk of incarceration and strangulation and I think he would benefit from having this fixed. He would also like to have this done. I have discussed with him in detail the risks and benefits of the operation to fix the hernia as well as some of the technical aspects including the use of mesh and he understands and wishes to proceed. Current Plans Pt Education - Hernia: discussed with patient and provided information.

## 2019-01-07 NOTE — Anesthesia Procedure Notes (Signed)
Procedure Name: Intubation Date/Time: 01/07/2019 1:00 PM Performed by: Bryson Corona, CRNA Pre-anesthesia Checklist: Patient identified, Emergency Drugs available, Suction available and Patient being monitored Patient Re-evaluated:Patient Re-evaluated prior to induction Oxygen Delivery Method: Circle System Utilized Preoxygenation: Pre-oxygenation with 100% oxygen Induction Type: IV induction Ventilation: Mask ventilation without difficulty Laryngoscope Size: Mac and 4 Grade View: Grade II Tube type: Oral Number of attempts: 1 Airway Equipment and Method: Stylet and Oral airway Placement Confirmation: ETT inserted through vocal cords under direct vision,  positive ETCO2 and breath sounds checked- equal and bilateral Secured at: 22 cm Tube secured with: Tape Dental Injury: Teeth and Oropharynx as per pre-operative assessment

## 2019-01-07 NOTE — Op Note (Addendum)
01/07/2019  1:36 PM  PATIENT:  Austin Little  38 y.o. male  PRE-OPERATIVE DIAGNOSIS:  UMBILICAL  HERNIA  POST-OPERATIVE DIAGNOSIS:  UMBILICAL  HERNIA  PROCEDURE:  Procedure(s): UMBILICAL HERNIA REPAIR WITH MESH (N/A)  SURGEON:  Surgeon(s) and Role:    * Jovita Kussmaul, MD - Primary  PHYSICIAN ASSISTANT:   ASSISTANTS: none   ANESTHESIA:   local and general  EBL:  minimal   BLOOD ADMINISTERED:none  DRAINS: none   LOCAL MEDICATIONS USED:  MARCAINE     SPECIMEN:  No Specimen  DISPOSITION OF SPECIMEN:  N/A  COUNTS:  YES  TOURNIQUET:  * No tourniquets in log *  DICTATION: .Dragon Dictation   After informed consent was obtained the patient was brought to the operating room and placed in the supine position on the operating table.  After adequate induction of general anesthesia the patient's abdomen was prepped with ChloraPrep, allowed to dry, and draped in usual sterile manner.  An appropriate timeout was performed.  The area around the umbilicus was infiltrated with quarter percent Marcaine.  A vertically oriented incision was made through the middle of the umbilicus with a 15 blade knife.  The incision was carried through the skin and subcutaneous tissue sharply with the electrocautery.  The hernia sac was identified and opened.  There was only omentum within the hernia sac.  The omentum was easily reduced.  The hernia sac was excised sharply with electrocautery.  The fascial edges were identified and very healthy and clean. The fascial defect only measured about 1.5cm.  A medium piece of ventral light umbilical hernia mesh was chosen.  The mesh was oriented with the coated side towards the bowel.  The mesh was placed through the fascial defect and with traction on the anchors the mesh was observed to be in good apposition to the anterior abdominal wall on the inside.  The fascial defect was then closed with interrupted #1 Novafil stitches incorporating the anchors of the mesh.   Once this was accomplished the mesh appeared to be in good position the hernia seem well repaired.  The wound was irrigated with copious amounts of saline.  The subcutaneous tissue was then closed with interrupted 2-0 Vicryl stitches.  The skin was then closed with a running 4-0 Monocryl subcuticular stitch.  Prior to closing the skin the thinned out ischemic skin was removed sharply with Metzenbaum scissors.  Dermabond dressings were applied.  The patient tolerated the procedure well.  At the end of the case all needle sponge and instrument counts were correct.  The patient was then awakened and taken to recovery in stable condition.  PLAN OF CARE: Discharge to home after PACU  PATIENT DISPOSITION:  PACU - hemodynamically stable.   Delay start of Pharmacological VTE agent (>24hrs) due to surgical blood loss or risk of bleeding: not applicable

## 2019-01-08 ENCOUNTER — Encounter: Payer: Self-pay | Admitting: *Deleted

## 2019-01-30 ENCOUNTER — Other Ambulatory Visit: Payer: Self-pay | Admitting: Family Medicine

## 2019-01-30 MED FILL — JANUVIA 100 MG TABLET: 100 | 60 days supply | Qty: 60 | Fill #1

## 2019-01-30 NOTE — Telephone Encounter (Signed)
Requested medication (s) are due for refill today: yes  Requested medication (s) are on the active medication list: yes  Last refill:  12/21/2018  Future visit scheduled: yes  Notes to clinic:  Review for refill    Requested Prescriptions  Pending Prescriptions Disp Refills   losartan (COZAAR) 25 MG tablet [Pharmacy Med Name: LOSARTAN POTASSIUM 25 MG TA 25 Tablet] 30 tablet 4    Sig: TAKE 1 TABLET BY MOUTH ONCE DAILY      There is no refill protocol information for this order

## 2019-01-31 ENCOUNTER — Other Ambulatory Visit: Payer: Self-pay

## 2019-01-31 ENCOUNTER — Ambulatory Visit: Payer: 59 | Admitting: Internal Medicine

## 2019-01-31 MED ORDER — LOSARTAN POTASSIUM 25 MG PO TABS: 25.0000 mg | ORAL_TABLET | Freq: Every day | ORAL | 0 refills | Status: DC

## 2019-01-31 MED FILL — LOSARTAN POTASSIUM 25 MG TA: 25 | 90 days supply | Qty: 90 | Fill #0

## 2019-02-25 ENCOUNTER — Encounter: Payer: Self-pay | Admitting: Family Medicine

## 2019-02-27 MED FILL — ROSUVASTATIN CALCIUM 40 MG: 40 | 90 days supply | Qty: 90 | Fill #1

## 2019-03-03 ENCOUNTER — Telehealth: Payer: Self-pay | Admitting: Family Medicine

## 2019-03-04 ENCOUNTER — Ambulatory Visit (INDEPENDENT_AMBULATORY_CARE_PROVIDER_SITE_OTHER): Payer: Self-pay | Admitting: Internal Medicine

## 2019-03-04 ENCOUNTER — Encounter: Payer: Self-pay | Admitting: Internal Medicine

## 2019-03-04 DIAGNOSIS — R21 Rash and other nonspecific skin eruption: Secondary | ICD-10-CM

## 2019-03-04 MED ORDER — TRIAMCINOLONE ACETONIDE 0.1 % EX CREA: 1.0000 "application " | TOPICAL_CREAM | Freq: Two times a day (BID) | CUTANEOUS | 1 refills | Status: AC

## 2019-03-04 MED FILL — TRIAMCINOLONE 0.1% CREAM: 0.1 | 14 days supply | Qty: 30 | Fill #0

## 2019-03-04 NOTE — Progress Notes (Signed)
Virtual Visit via Telephone Note  I connected with Austin Little, on 03/04/2019 at 9:10 AM by telephone due to the COVID-19 pandemic and verified that I am speaking with the correct person using two identifiers.   Consent: I discussed the limitations, risks, security and privacy concerns of performing an evaluation and management service by telephone and the availability of in person appointments. I also discussed with the patient that there may be a patient responsible charge related to this service. The patient expressed understanding and agreed to proceed.   Location of Patient: Home   Location of Provider: Clinic    Persons participating in Telemedicine visit: El L Hennie Duos [facility] Dr. Earlene Plater      History of Present Illness: Patient has a visit for rash. Has concerns about rash present for about one month, present on back, arms, and ankles. Has been present for about one month. Had the same thing happen on his ankle about ten years ago and it continuously returns. It is itchy. Tried some anti itch cream and it improved symptoms.    Past Medical History:  Diagnosis Date  . Diabetes mellitus without complication (HCC)   . GERD (gastroesophageal reflux disease)   . Hypertension    No Known Allergies  Current Outpatient Medications on File Prior to Visit  Medication Sig Dispense Refill  . FREESTYLE LITE test strip     . Lancets (FREESTYLE) lancets     . losartan (COZAAR) 25 MG tablet Take 1 tablet (25 mg total) by mouth daily. 90 tablet 0  . metFORMIN (GLUCOPHAGE) 1000 MG tablet Take 1 tablet (1,000 mg total) by mouth 2 (two) times daily with a meal. 180 tablet 3  . omeprazole (PRILOSEC OTC) 20 MG tablet Take 20 mg by mouth daily.    . rosuvastatin (CRESTOR) 40 MG tablet Take 40 mg by mouth daily.    . sitaGLIPtin (JANUVIA) 100 MG tablet Take 1 tablet (100 mg total) by mouth daily. 90 tablet 1   No current facility-administered medications on file prior to  visit.    Observations/Objective: NAD. Speaking clearly.  Work of breathing normal.  Alert and oriented. Mood appropriate.  See media pictures. Appears to have erythematous, scaling patches present on several areas of the body.   Assessment and Plan: 1. Rash and nonspecific skin eruption Rash is most concerning for eczema especially given diffuse, itchy presentation and history of it recurring. Will treat with steroid cream.  - triamcinolone cream (KENALOG) 0.1 %; Apply 1 application topically 2 (two) times daily.  Dispense: 30 g; Refill: 1   Follow Up Instructions: Follow up prn    I discussed the assessment and treatment plan with the patient. The patient was provided an opportunity to ask questions and all were answered. The patient agreed with the plan and demonstrated an understanding of the instructions.   The patient was advised to call back or seek an in-person evaluation if the symptoms worsen or if the condition fails to improve as anticipated.     I provided 10 minutes total of non-face-to-face time during this encounter including median intraservice time, reviewing previous notes, investigations, ordering medications, medical decision making, coordinating care and patient verbalized understanding at the end of the visit.    Marcy Siren, D.O. Primary Care at [facility]  03/04/2019, 9:10 AM

## 2019-03-10 ENCOUNTER — Ambulatory Visit: Payer: Self-pay | Admitting: Internal Medicine

## 2019-03-11 ENCOUNTER — Other Ambulatory Visit: Payer: Self-pay

## 2019-03-11 DIAGNOSIS — Z114 Encounter for screening for human immunodeficiency virus [HIV]: Secondary | ICD-10-CM

## 2019-03-11 DIAGNOSIS — E1165 Type 2 diabetes mellitus with hyperglycemia: Secondary | ICD-10-CM

## 2019-03-11 NOTE — Progress Notes (Signed)
Patient here for repeat labs. 

## 2019-03-12 LAB — COMPREHENSIVE METABOLIC PANEL
ALT: 112 IU/L — ABNORMAL HIGH (ref 0–44)
AST: 72 IU/L — ABNORMAL HIGH (ref 0–40)
Albumin/Globulin Ratio: 1.8 (ref 1.2–2.2)
Albumin: 4.9 g/dL (ref 4.0–5.0)
Alkaline Phosphatase: 155 IU/L — ABNORMAL HIGH (ref 39–117)
BUN/Creatinine Ratio: 9 (ref 9–20)
BUN: 8 mg/dL (ref 6–20)
Bilirubin Total: 0.5 mg/dL (ref 0.0–1.2)
CO2: 23 mmol/L (ref 20–29)
Calcium: 10.3 mg/dL — ABNORMAL HIGH (ref 8.7–10.2)
Chloride: 99 mmol/L (ref 96–106)
Creatinine, Ser: 0.85 mg/dL (ref 0.76–1.27)
GFR calc Af Amer: 127 mL/min/{1.73_m2} (ref >59)
GFR calc non Af Amer: 110 mL/min/{1.73_m2} (ref >59)
Globulin, Total: 2.7 g/dL (ref 1.5–4.5)
Glucose: 142 mg/dL — ABNORMAL HIGH (ref 65–99)
Potassium: 5.3 mmol/L — ABNORMAL HIGH (ref 3.5–5.2)
Sodium: 139 mmol/L (ref 134–144)
Total Protein: 7.6 g/dL (ref 6.0–8.5)

## 2019-03-12 LAB — HEMOGLOBIN A1C
Est. average glucose Bld gHb Est-mCnc: 140 mg/dL
Hgb A1c MFr Bld: 6.5 % — ABNORMAL HIGH (ref 4.8–5.6)

## 2019-03-12 LAB — HIV ANTIBODY (ROUTINE TESTING W REFLEX): HIV Screen 4th Generation wRfx: NONREACTIVE

## 2019-03-14 NOTE — Progress Notes (Signed)
Patient notified of results & recommendations. Expressed understanding.

## 2019-04-15 NOTE — Telephone Encounter (Signed)
error 

## 2019-04-29 MED FILL — METFORMIN HCL 1000 MG TABS: 1000 | 90 days supply | Qty: 180 | Fill #2

## 2019-04-29 MED FILL — JANUVIA 100 MG TABLET: 100 | 30 days supply | Qty: 30 | Fill #2

## 2019-04-29 MED FILL — TRIAMCINOLONE ACETONIDE 0.1: 0.1 | 14 days supply | Qty: 30 | Fill #1

## 2019-07-08 ENCOUNTER — Telehealth: Payer: Self-pay

## 2019-07-08 NOTE — Telephone Encounter (Signed)

## 2019-07-09 ENCOUNTER — Ambulatory Visit: Payer: Self-pay | Admitting: Internal Medicine

## 2019-07-14 ENCOUNTER — Other Ambulatory Visit: Payer: Self-pay | Admitting: Family Medicine

## 2019-07-14 MED FILL — JANUVIA 100 MG TABLET: 100 | 30 days supply | Qty: 30 | Fill #3

## 2019-07-14 MED FILL — LOSARTAN POTASSIUM 25 MG TA: 25 | 30 days supply | Qty: 30 | Fill #0

## 2019-08-05 ENCOUNTER — Telehealth: Payer: Self-pay

## 2019-08-05 NOTE — Telephone Encounter (Signed)

## 2019-08-06 ENCOUNTER — Ambulatory Visit (INDEPENDENT_AMBULATORY_CARE_PROVIDER_SITE_OTHER): Payer: No Typology Code available for payment source | Admitting: Internal Medicine

## 2019-08-06 ENCOUNTER — Other Ambulatory Visit: Payer: Self-pay

## 2019-08-06 ENCOUNTER — Encounter: Payer: Self-pay | Admitting: Internal Medicine

## 2019-08-06 ENCOUNTER — Other Ambulatory Visit: Payer: Self-pay | Admitting: Internal Medicine

## 2019-08-06 VITALS — BP 125/79 | HR 102 | Temp 98.2°F | Resp 17 | Wt 223.0 lb

## 2019-08-06 DIAGNOSIS — E782 Mixed hyperlipidemia: Secondary | ICD-10-CM

## 2019-08-06 DIAGNOSIS — I10 Essential (primary) hypertension: Secondary | ICD-10-CM

## 2019-08-06 DIAGNOSIS — E119 Type 2 diabetes mellitus without complications: Secondary | ICD-10-CM | POA: Diagnosis not present

## 2019-08-06 LAB — GLUCOSE, POCT (MANUAL RESULT ENTRY): POC Glucose: 168 mg/dl — AB (ref 70–99)

## 2019-08-06 LAB — POCT GLYCOSYLATED HEMOGLOBIN (HGB A1C): Hemoglobin A1C: 6.6 % — AB (ref 4.0–5.6)

## 2019-08-06 MED ORDER — SITAGLIPTIN PHOSPHATE 100 MG PO TABS: 100.0000 mg | ORAL_TABLET | Freq: Every day | ORAL | 1 refills | Status: DC

## 2019-08-06 MED ORDER — LOSARTAN POTASSIUM 25 MG PO TABS: 25.0000 mg | ORAL_TABLET | Freq: Every day | ORAL | 1 refills | Status: AC

## 2019-08-06 MED ORDER — ROSUVASTATIN CALCIUM 40 MG PO TABS: 40.0000 mg | ORAL_TABLET | Freq: Every day | ORAL | 1 refills | Status: AC

## 2019-08-06 MED ORDER — METFORMIN HCL 1000 MG PO TABS: 1000.0000 mg | ORAL_TABLET | Freq: Two times a day (BID) | ORAL | 1 refills | Status: AC

## 2019-08-06 MED FILL — ROSUVASTATIN CALCIUM 40 MG: 40 | 90 days supply | Qty: 90 | Fill #0

## 2019-08-06 MED FILL — LOSARTAN POTASSIUM 25 MG TA: 25 | 90 days supply | Qty: 90 | Fill #0

## 2019-08-06 MED FILL — JANUVIA 100 MG TABLET: 100 | 30 days supply | Qty: 30 | Fill #0

## 2019-08-06 MED FILL — METFORMIN HCL 1000 MG TABS: 1000 | 90 days supply | Qty: 180 | Fill #0

## 2019-08-06 NOTE — Progress Notes (Signed)
Subjective:    Austin Little - 39 y.o. male MRN [id number]  Date of birth: 21-Sep-1980  HPI  Austin Little is here for follow up of chronic medical conditions.  Diabetes mellitus, Type 2 Disease Monitoring             Blood Sugar Ranges:  Has not been checking recently as all CBGs at home were within range.              Polyuria: no             Visual problems: no   Urine Microalbumin: collected today, on Arb   Last A1C: 6.5 (Feb 2021)   Medications: Metformin 1000 mg BID, Januvia 100 mg daily  Medication Compliance: yes  Medication Side Effects             Hypoglycemia: no   Chronic HTN Disease Monitoring:  Home BP Monitoring - None.  Chest pain- no  Dyspnea- no Headache - no  Medications: Losartan 25 mg Compliance- yes Lightheadedness- no  Edema- no        Health Maintenance:  Health Maintenance Due  Topic Date Due  . COVID-19 Vaccine (1) Never done    -  reports that he has been smoking cigars. He has never used smokeless tobacco. - Review of Systems: Per HPI. - Past Medical History: Patient Active Problem List   Diagnosis Date Noted  . Essential hypertension 08/27/2018  . Fatty liver, alcoholic 08/27/2018  . Abnormal LFTs 08/27/2018  . Mixed hyperlipidemia 08/27/2018  . Light smoker 08/27/2018  . Alcohol use disorder, mild, abuse 08/27/2018  . Umbilical hernia without obstruction and without gangrene 08/27/2018  . Type 2 diabetes mellitus with hyperglycemia, without long-term current use of insulin (HCC) 08/01/2018   - Medications: reviewed and updated   Objective:   Physical Exam BP 125/79   Pulse (!) 102   Temp 98.2 F (36.8 C) (Temporal)   Resp 17   Wt 223 lb (101.2 kg)   SpO2 95%   BMI 27.87 kg/m  Physical Exam Constitutional:      General: He is not in acute distress.    Appearance: He is not diaphoretic.  HENT:     Head: Normocephalic and atraumatic.  Eyes:     Conjunctiva/sclera: Conjunctivae normal.   Cardiovascular:     Rate and Rhythm: Normal rate and regular rhythm.     Heart sounds: Normal heart sounds. No murmur heard.   Pulmonary:     Effort: Pulmonary effort is normal. No respiratory distress.     Breath sounds: Normal breath sounds.  Musculoskeletal:        General: Normal range of motion.  Skin:    General: Skin is warm and dry.  Neurological:     Mental Status: He is alert and oriented to person, place, and time.  Psychiatric:        Mood and Affect: Affect normal.        Judgment: Judgment normal.            Assessment & Plan:   1. Type 2 diabetes mellitus without complication, without long-term current use of insulin (HCC) A1c 6.6. We discussed option of weaning off medications but patient denies hypoglycemic episodes and is fine to continue. Will monitor every 6 months due to very good glycemic control.  Counseled on Diabetic diet, my plate method, 176 minutes of moderate intensity exercise/week Blood sugar logs with fasting goals of 80-120 mg/dl, random of less than 160  and in the event of sugars less than 60 mg/dl or greater than 400 mg/dl encouraged to notify the clinic. Advised on the need for annual eye exams, annual foot exams, Pneumonia vaccine. - Glucose (CBG) - HgB A1c - Microalbumin/Creatinine Ratio, Urine - losartan (COZAAR) 25 MG tablet; Take 1 tablet (25 mg total) by mouth daily.  Dispense: 90 tablet; Refill: 1 - metFORMIN (GLUCOPHAGE) 1000 MG tablet; Take 1 tablet (1,000 mg total) by mouth 2 (two) times daily with a meal.  Dispense: 180 tablet; Refill: 1 - sitaGLIPtin (JANUVIA) 100 MG tablet; Take 1 tablet (100 mg total) by mouth daily.  Dispense: 90 tablet; Refill: 1  2. Essential hypertension BP at goal. Continue Losartan. Noted that K was mildly high at 5.3. Repeat due to use of Arb.  - CBC with Differential - losartan (COZAAR) 25 MG tablet; Take 1 tablet (25 mg total) by mouth daily.  Dispense: 90 tablet; Refill: 1 - Basic Metabolic  Panel  3. Mixed hyperlipidemia LDL at goal of 70. Continue Crestor.  - rosuvastatin (CRESTOR) 40 MG tablet; Take 1 tablet (40 mg total) by mouth daily.  Dispense: 90 tablet; Refill: 1     Marcy Siren, D.O. 08/06/2019, 10:09 AM Primary Care at [facility]

## 2019-08-07 LAB — CBC WITH DIFFERENTIAL/PLATELET
Basophils Absolute: 0.1 10*3/uL (ref 0.0–0.2)
Basos: 1 %
EOS (ABSOLUTE): 0.1 10*3/uL (ref 0.0–0.4)
Eos: 2 %
Hematocrit: 45.2 % (ref 37.5–51.0)
Hemoglobin: 15.7 g/dL (ref 13.0–17.7)
Immature Grans (Abs): 0 10*3/uL (ref 0.0–0.1)
Immature Granulocytes: 0 %
Lymphocytes Absolute: 1.8 10*3/uL (ref 0.7–3.1)
Lymphs: 37 %
MCH: 30.1 pg (ref 26.6–33.0)
MCHC: 34.7 g/dL (ref 31.5–35.7)
MCV: 87 fL (ref 79–97)
Monocytes Absolute: 0.4 10*3/uL (ref 0.1–0.9)
Monocytes: 9 %
Neutrophils Absolute: 2.5 10*3/uL (ref 1.4–7.0)
Neutrophils: 51 %
Platelets: 410 10*3/uL (ref 150–450)
RBC: 5.22 x10E6/uL (ref 4.14–5.80)
RDW: 12.4 % (ref 11.6–15.4)
WBC: 5 10*3/uL (ref 3.4–10.8)

## 2019-08-07 LAB — MICROALBUMIN / CREATININE URINE RATIO
Creatinine, Urine: 238 mg/dL
Microalb/Creat Ratio: 58 mg/g creat — ABNORMAL HIGH (ref 0–29)
Microalbumin, Urine: 139.1 ug/mL

## 2019-08-07 LAB — BASIC METABOLIC PANEL
BUN/Creatinine Ratio: 10 (ref 9–20)
BUN: 9 mg/dL (ref 6–20)
CO2: 18 mmol/L — ABNORMAL LOW (ref 20–29)
Calcium: 9.5 mg/dL (ref 8.7–10.2)
Chloride: 99 mmol/L (ref 96–106)
Creatinine, Ser: 0.92 mg/dL (ref 0.76–1.27)
GFR calc Af Amer: 121 mL/min/{1.73_m2} (ref >59)
GFR calc non Af Amer: 104 mL/min/{1.73_m2} (ref >59)
Glucose: 143 mg/dL — ABNORMAL HIGH (ref 65–99)
Potassium: 4.2 mmol/L (ref 3.5–5.2)
Sodium: 136 mmol/L (ref 134–144)

## 2019-09-03 MED FILL — METFORMIN HCL 1000 MG TABS: 1000 | 90 days supply | Qty: 180 | Fill #0

## 2019-09-03 MED FILL — LOSARTAN POTASSIUM 25 MG TA: 25 | 90 days supply | Qty: 90 | Fill #0

## 2019-09-03 MED FILL — JANUVIA 100 MG TABLET: 100 | 30 days supply | Qty: 30 | Fill #0

## 2019-09-03 MED FILL — ROSUVASTATIN CALCIUM 40 MG: 40 | 90 days supply | Qty: 90 | Fill #0

## 2019-12-10 MED FILL — JANUVIA 100 MG TABLET: 100 | 30 days supply | Qty: 30 | Fill #1

## 2020-03-26 MED FILL — JANUVIA 100 MG TABLET: 100 | 30 days supply | Qty: 30 | Fill #2

## 2020-05-03 ENCOUNTER — Other Ambulatory Visit (HOSPITAL_COMMUNITY): Payer: Self-pay

## 2020-05-03 MED ORDER — LOSARTAN POTASSIUM 25 MG PO TABS
25.0000 mg | ORAL_TABLET | Freq: Every day | ORAL | 0 refills | Status: AC
  Filled 2020-05-03: qty 90, 90d supply, fill #0

## 2020-05-14 ENCOUNTER — Other Ambulatory Visit (HOSPITAL_COMMUNITY): Payer: Self-pay

## 2020-05-14 MED FILL — Sitagliptin Phosphate Tab 100 MG (Base Equiv): ORAL | 30 days supply | Qty: 30 | Fill #0 | Status: AC

## 2020-06-02 ENCOUNTER — Other Ambulatory Visit (HOSPITAL_COMMUNITY): Payer: Self-pay

## 2020-06-02 MED ORDER — ROSUVASTATIN CALCIUM 40 MG PO TABS
40.0000 mg | ORAL_TABLET | Freq: Every day | ORAL | 0 refills | Status: AC
  Filled 2020-06-02: qty 90, 90d supply, fill #0

## 2020-06-10 ENCOUNTER — Other Ambulatory Visit (HOSPITAL_COMMUNITY): Payer: Self-pay

## 2020-10-02 IMAGING — CT CT ABDOMEN AND PELVIS WITH CONTRAST
2 of 4 series · 16 of 46 positions shown, 18 images · IV contrast (OMNIPAQUE)
Comparison: None.

CLINICAL DATA: Swelling around umbilicus, question hernia.

EXAM:
CT ABDOMEN AND PELVIS WITH CONTRAST
TECHNIQUE: Multidetector CT imaging of the abdomen and pelvis was performed
using the standard protocol following bolus administration of
intravenous contrast.
CONTRAST:  100mL OMNIPAQUE IOHEXOL 300 MG/ML  SOLN

[Series 2: axial st · axial · 0.90mm/px · z∈[-87,+373]mm · 13 of 104 slices shown, 15 images]
[im 6/104  soft-tissue]
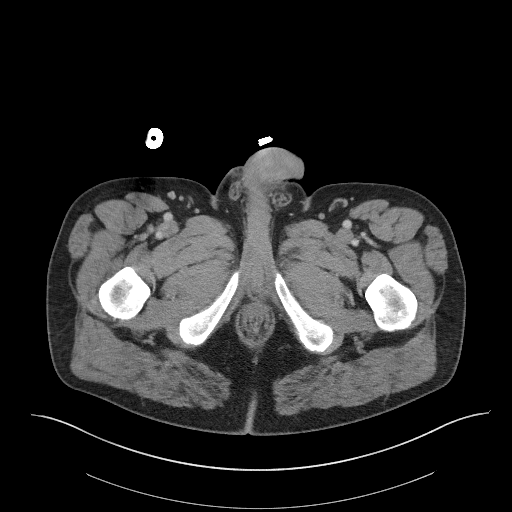
[im 6/104  bone]
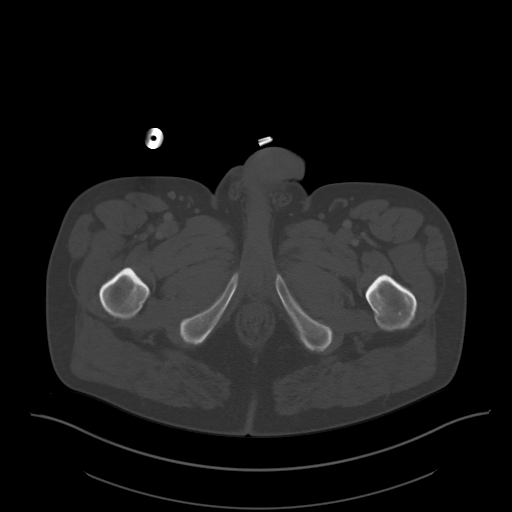
[im 12/104  soft-tissue]
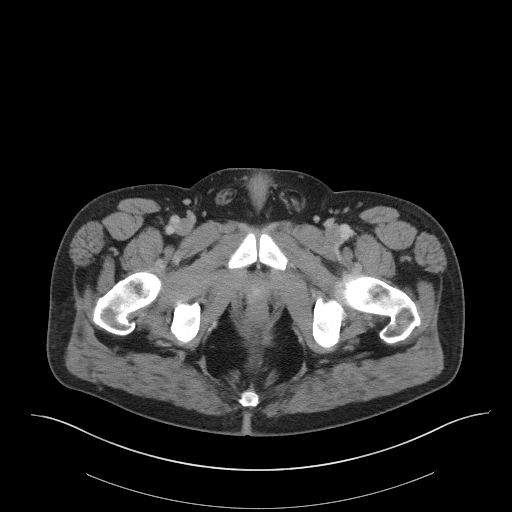
[im 23/104  soft-tissue]
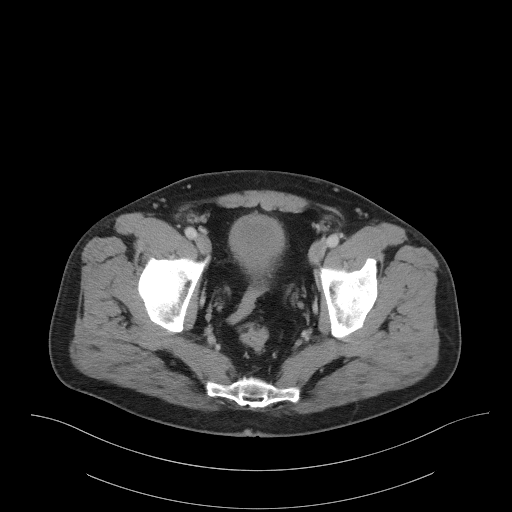
[im 29/104  soft-tissue]
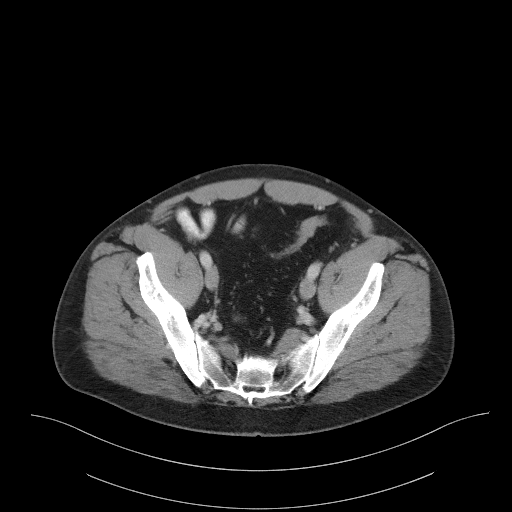
[im 35/104  soft-tissue]
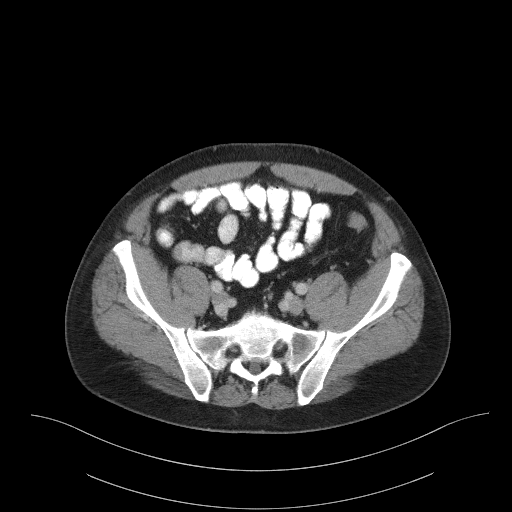
[im 46/104  soft-tissue]
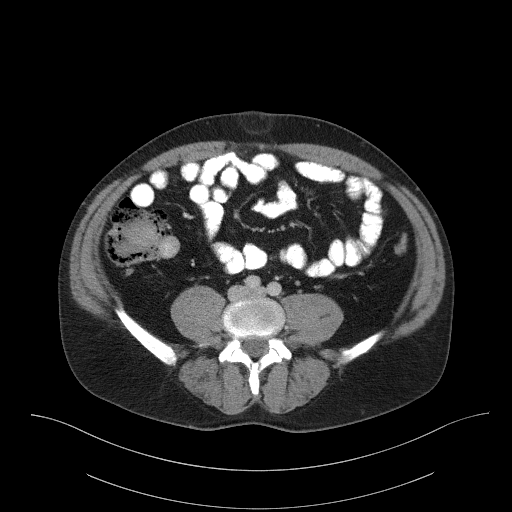
[im 52/104  soft-tissue]
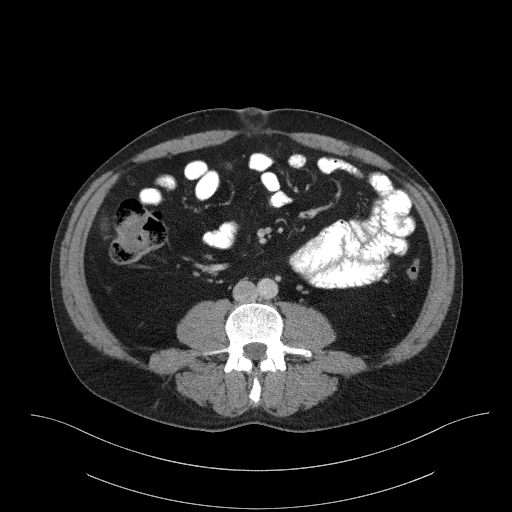
[im 58/104  soft-tissue]
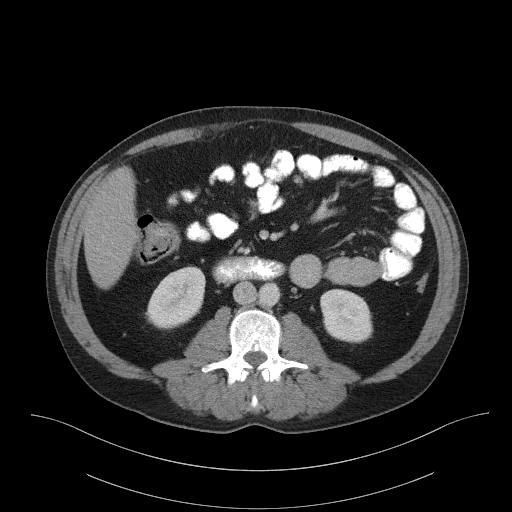
[im 69/104  soft-tissue]
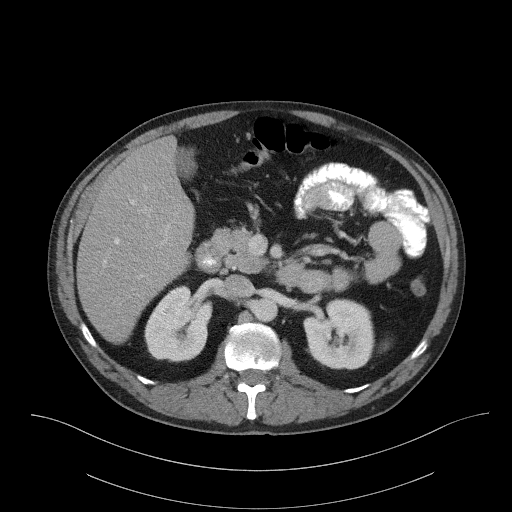
[im 69/104  bone]
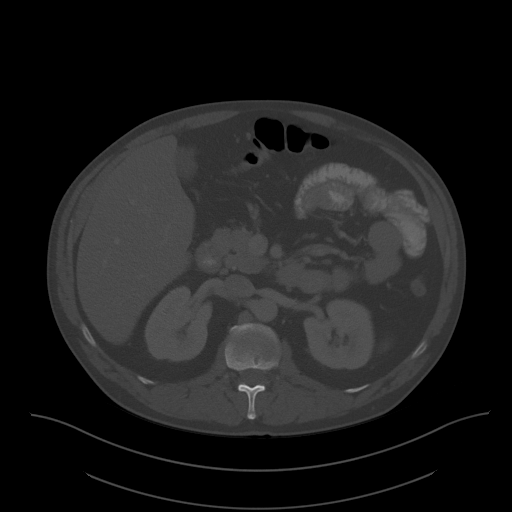
[im 75/104  soft-tissue]
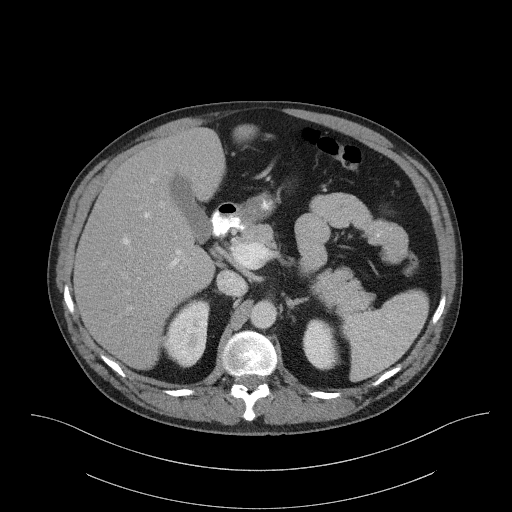
[im 81/104  soft-tissue]
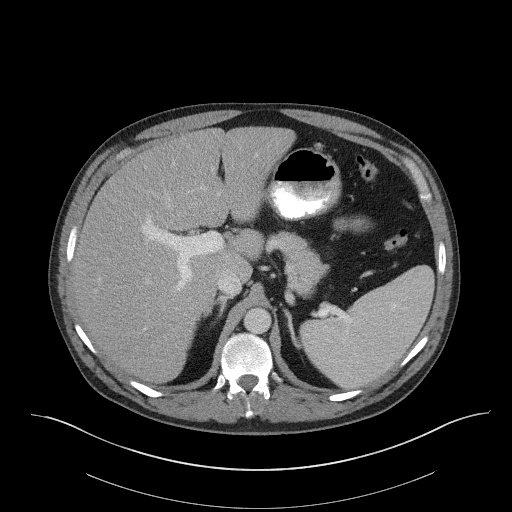
[im 92/104  soft-tissue]
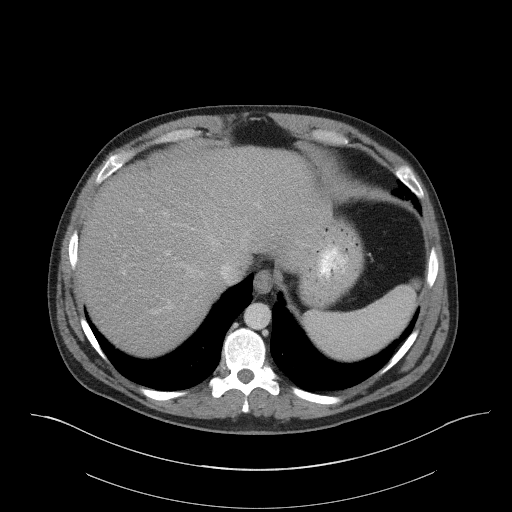
[im 98/104  soft-tissue]
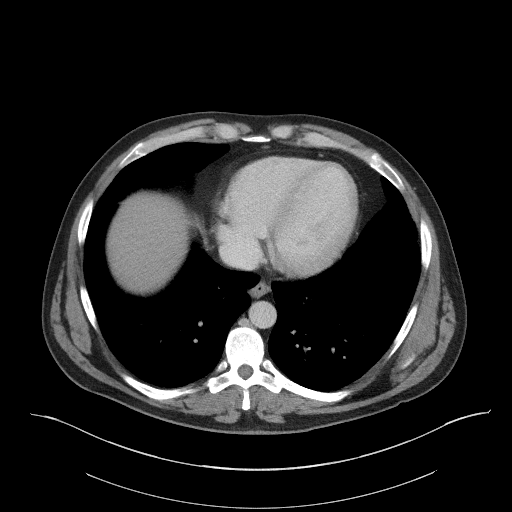

[Series 4: coronal st · coronal · 0.89mm/px · 3 of 94 slices shown]
[im 32/94  soft-tissue]
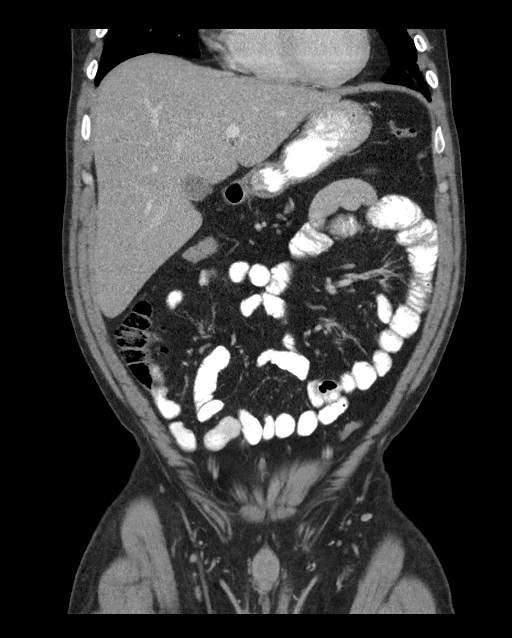
[im 42/94  soft-tissue]
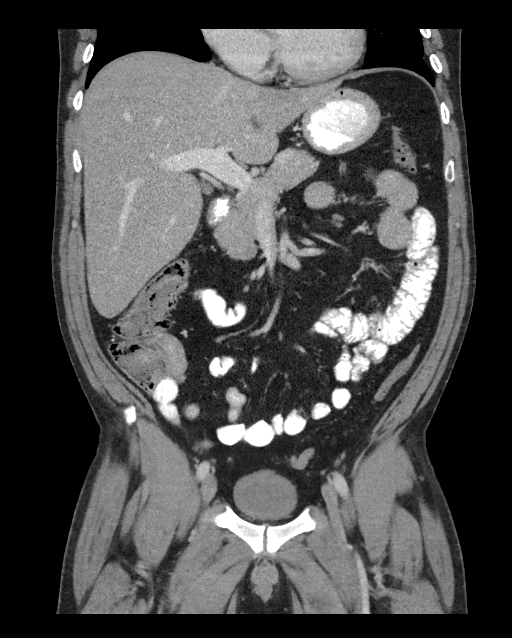
[im 52/94  soft-tissue]
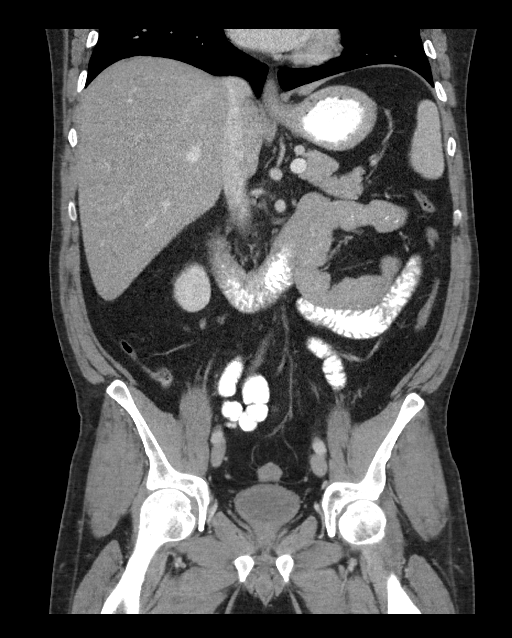

[16 of 46 positions shown; findings below may reference images not displayed]

FINDINGS: Lower chest: Lung bases are clear. No effusions. Heart is normal
size.

Hepatobiliary: Diffuse low-density throughout the liver compatible
with fatty infiltration. No focal abnormality. Gallbladder
unremarkable.

Pancreas: No focal abnormality or ductal dilatation.

Spleen: No focal abnormality.  Normal size.

Adrenals/Urinary Tract: No adrenal abnormality. No focal renal
abnormality. No stones or hydronephrosis. Urinary bladder is
unremarkable.

Stomach/Bowel: Stomach, large and small bowel grossly unremarkable.
Appendix is normal.

Vascular/Lymphatic: No evidence of aneurysm or adenopathy.

Reproductive: Central prostate calcifications.

Other: No free fluid or free air. Small umbilical hernia containing
fat.

Musculoskeletal: No acute bony abnormality.
IMPRESSION: Small umbilical hernia containing fat.

Fatty infiltration of the liver.

No acute findings.

## 2021-03-09 IMAGING — DX DG CHEST 1V PORT
1 series · 1 of 1 positions shown · non-contrast
Comparison: None.

CLINICAL DATA: Chest palpitations.

EXAM:
PORTABLE CHEST 1 VIEW

[chest]
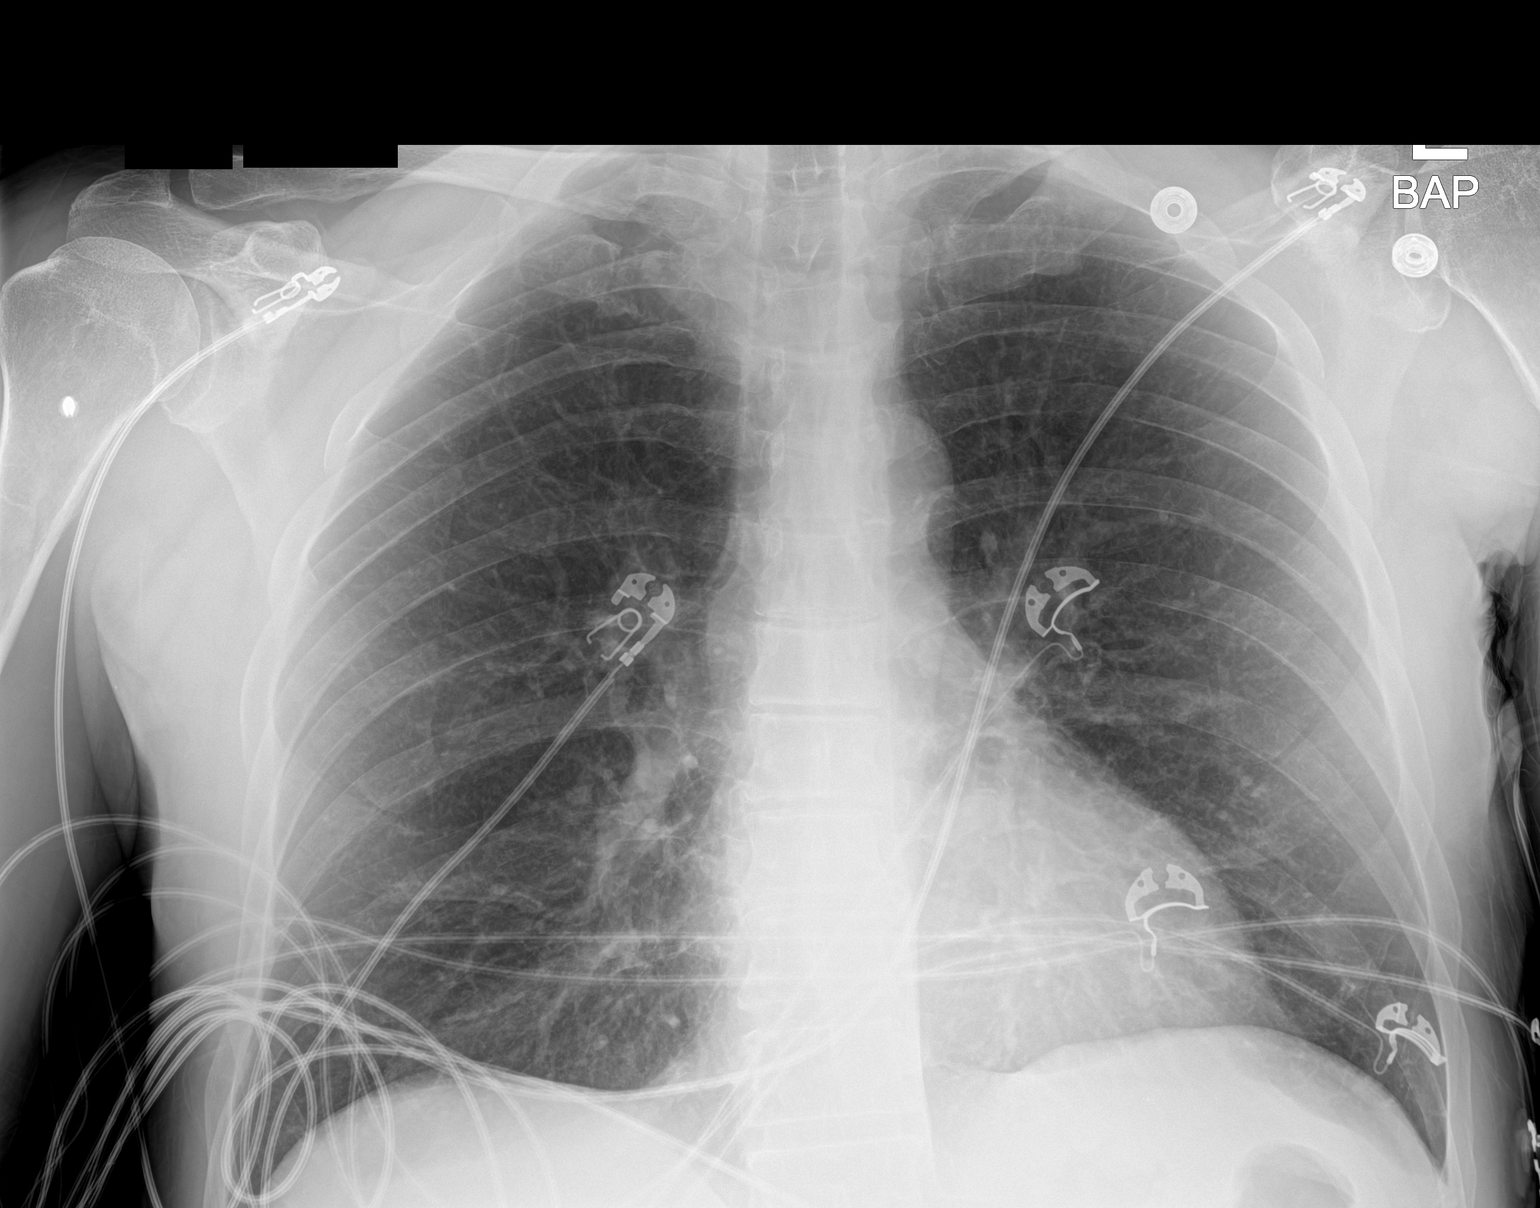

[1 of 1 positions shown; findings below may reference images not displayed]

FINDINGS: Lungs are clear. Heart size is normal. No pneumothorax or pleural
effusion. No acute or focal bony abnormality.
IMPRESSION: No acute disease.
